# Patient Record
Sex: Male | Born: 1935 | Race: White | Hispanic: No | Marital: Single | Smoking: Never smoker
Health system: Southern US, Community
[De-identification: ages and names within clinical notes are randomized; demographics above are authoritative.]

## PROBLEM LIST (undated history)

## (undated) DIAGNOSIS — R0602 Shortness of breath: Secondary | ICD-10-CM

## (undated) DIAGNOSIS — Z85828 Personal history of other malignant neoplasm of skin: Secondary | ICD-10-CM

## (undated) DIAGNOSIS — R002 Palpitations: Secondary | ICD-10-CM

## (undated) DIAGNOSIS — R55 Syncope and collapse: Secondary | ICD-10-CM

## (undated) DIAGNOSIS — M1990 Unspecified osteoarthritis, unspecified site: Secondary | ICD-10-CM

## (undated) DIAGNOSIS — C801 Malignant (primary) neoplasm, unspecified: Secondary | ICD-10-CM

## (undated) DIAGNOSIS — I10 Essential (primary) hypertension: Secondary | ICD-10-CM

## (undated) HISTORY — PX: FRACTURE SURGERY: SHX138

## (undated) HISTORY — PX: EYE SURGERY: SHX253

## (undated) HISTORY — DX: Malignant (primary) neoplasm, unspecified: C80.1

## (undated) HISTORY — DX: Essential (primary) hypertension: I10

## (undated) HISTORY — DX: Palpitations: R00.2

## (undated) HISTORY — DX: Shortness of breath: R06.02

## (undated) HISTORY — DX: Unspecified osteoarthritis, unspecified site: M19.90

## (undated) HISTORY — DX: Personal history of other malignant neoplasm of skin: Z85.828

## (undated) HISTORY — DX: Syncope and collapse: R55

---

## 2005-01-02 LAB — HM COLONOSCOPY: HM Colonoscopy: NORMAL

## 2005-05-09 ENCOUNTER — Ambulatory Visit (HOSPITAL_COMMUNITY): Admission: RE | Admit: 2005-05-09 | Discharge: 2005-05-09 | Payer: Self-pay | Admitting: Gastroenterology

## 2007-07-18 DIAGNOSIS — R55 Syncope and collapse: Secondary | ICD-10-CM

## 2007-07-18 DIAGNOSIS — R0602 Shortness of breath: Secondary | ICD-10-CM

## 2007-07-18 DIAGNOSIS — R002 Palpitations: Secondary | ICD-10-CM

## 2007-07-18 HISTORY — DX: Shortness of breath: R06.02

## 2007-07-18 HISTORY — DX: Syncope and collapse: R55

## 2007-07-18 HISTORY — PX: US ECHOCARDIOGRAPHY: HXRAD669

## 2007-07-18 HISTORY — DX: Palpitations: R00.2

## 2007-07-18 HISTORY — PX: CARDIOVASCULAR STRESS TEST: SHX262

## 2010-10-30 ENCOUNTER — Ambulatory Visit: Payer: Self-pay | Admitting: Family Medicine

## 2010-11-30 DIAGNOSIS — Z85828 Personal history of other malignant neoplasm of skin: Secondary | ICD-10-CM | POA: Insufficient documentation

## 2010-11-30 DIAGNOSIS — I10 Essential (primary) hypertension: Secondary | ICD-10-CM

## 2010-11-30 HISTORY — DX: Personal history of other malignant neoplasm of skin: Z85.828

## 2010-11-30 HISTORY — DX: Essential (primary) hypertension: I10

## 2010-12-21 NOTE — Assessment & Plan Note (Signed)
Summary: new to est//ccm/pt rsc/cjr/pt rsc from bmp/cjr   Vital Signs:  Patient profile:   77 year old male Height:      65.50 inches Weight:      175 pounds BMI:     28.78 Temp:     97.4 degrees F oral Pulse rate:   72 / minute Pulse rhythm:   regular Resp:     12 per minute BP sitting:   150 / 78  (left arm) Cuff size:   regular  Vitals Entered By: Sid Falcon LPN (November 30, 2010 11:35 AM)  Nutrition Counseling: Patient's BMI is greater than 25 and therefore counseled on weight management options.   History of Present Illness:  new patient to establish care. Patient has history of hypertension. Took himself off medication in November. No side effects. He just decided to stop and change physicians. Reported prior history of dizziness but no syncope. Rare episodes. No other chronic medical problems. No prior surgeries.  Family history unrevealing  Patient is divorced. 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 Drinks one glass of one per night. Nonsmoker.  History skin cancer left ear diagnosed within past year. Questionable squamous cell  Preventive Screening-Counseling & Management  Alcohol-Tobacco     Smoking Status: quit     Year Started: 1955     Year Quit: 1957  Allergies (verified): No Known Drug Allergies  Past History:  Social History: Last updated: 11/30/2010 Occupation:  Proofreader Divorce past smoker (X 2 years only)  Risk Factors: Smoking Status: quit (11/30/2010)  Past Medical History: fainting spells hypertension PMH-FH-SH reviewed for relevance  Social History: Occupation:  Proofreader Divorce past smoker (X 2 years only) Occupation:  employed Smoking Status:  quit  Review of Systems  The patient denies anorexia, fever, weight loss, weight gain, vision loss, decreased hearing, hoarseness, chest pain, syncope, dyspnea on exertion, peripheral edema, prolonged cough, headaches, hemoptysis, abdominal pain, melena,  hematochezia, severe indigestion/heartburn, hematuria, incontinence, genital sores, muscle weakness, suspicious skin lesions, transient blindness, difficulty walking, depression, unusual weight change, abnormal bleeding, enlarged lymph nodes, and testicular masses.    Physical Exam  General:  Well-developed,well-nourished,in no acute distress; alert,appropriate and cooperative throughout examination Head:  normocephalic and atraumatic.   Eyes:  pupils equal, pupils round, and pupils reactive to light.   Ears:  External ear exam shows no significant lesions or deformities.  Otoscopic examination reveals clear canals, tympanic membranes are intact bilaterally without bulging, retraction, inflammation or discharge. Hearing is grossly normal bilaterally. Mouth:  Oral mucosa and oropharynx without lesions or exudates.  Teeth in good repair. Neck:  No deformities, masses, or tenderness noted. Lungs:  Normal respiratory effort, chest expands symmetrically. Lungs are clear to auscultation, no crackles or wheezes. Heart:  Normal rate and regular rhythm. S1 and S2 normal without gallop, murmur, click, rub or other extra sounds. Extremities:  No clubbing, cyanosis, edema, or deformity noted with normal full range of motion of all joints.   Neurologic:  alert & oriented X3 and cranial nerves II-XII intact.   Skin:  no rashes and no suspicious lesions.   Cervical Nodes:  No lymphadenopathy noted Psych:  normally interactive, good eye contact, not anxious appearing, and not depressed appearing.     Impression & Recommendations:  Problem # 1:  ESSENTIAL HYPERTENSION (ICD-401.9) start low dose amlodipine His updated medication list for this problem includes:    Amlodipine Besylate 2.5 Mg Tabs (Amlodipine besylate) ..... One by mouth once daily  Problem #  2:  SKIN CANCER, HX OF (ICD-V10.83)  Complete Medication List: 1)  Aleve 220 Mg Tabs (Naproxen sodium) .... One tab two times a day 2)  Amlodipine  Besylate 2.5 Mg Tabs (Amlodipine besylate) .... One by mouth once daily  Patient Instructions: 1)  Please schedule a follow-up appointment in 1 month.  2)  Check your  Blood Pressure regularly . If it is above:  140/90 you should make an appointment. Prescriptions: AMLODIPINE BESYLATE 2.5 MG TABS (AMLODIPINE BESYLATE) one by mouth once daily  #30 x 5   Entered and Authorized by:   Evelena Peat MD   Signed by:   Evelena Peat MD on 11/30/2010   Method used:   Electronically to        [facility] [phone] (retail)       [address] [place], [place]  [address]       Ph: [phone]       Fax: [phone]   RxID:   [id number]    Orders Added: 1)  New Patient Level III [25366]    Preventive Care Screening  Colonoscopy:    Date:  11/19/2004    Results:  normal

## 2011-01-02 ENCOUNTER — Encounter: Payer: Self-pay | Admitting: Family Medicine

## 2011-01-02 ENCOUNTER — Ambulatory Visit (INDEPENDENT_AMBULATORY_CARE_PROVIDER_SITE_OTHER): Payer: Medicare Other | Admitting: Family Medicine

## 2011-01-02 DIAGNOSIS — M1990 Unspecified osteoarthritis, unspecified site: Secondary | ICD-10-CM

## 2011-01-02 DIAGNOSIS — R319 Hematuria, unspecified: Secondary | ICD-10-CM

## 2011-01-02 DIAGNOSIS — I10 Essential (primary) hypertension: Secondary | ICD-10-CM

## 2011-01-02 DIAGNOSIS — E785 Hyperlipidemia, unspecified: Secondary | ICD-10-CM

## 2011-01-02 DIAGNOSIS — Z0000 Encounter for general adult medical examination without abnormal findings: Secondary | ICD-10-CM

## 2011-01-02 LAB — CBC WITH DIFFERENTIAL/PLATELET
Basophils Absolute: 0 10*3/uL (ref 0.0–0.1)
HCT: 40.4 % (ref 39.0–52.0)
Hemoglobin: 13.8 g/dL (ref 13.0–17.0)
Lymphs Abs: 1.4 10*3/uL (ref 0.7–4.0)
MCHC: 34.2 g/dL (ref 30.0–36.0)
MCV: 98.9 fl (ref 78.0–100.0)
Neutrophils Relative %: 71.5 % (ref 43.0–77.0)
RBC: 4.09 Mil/uL — ABNORMAL LOW (ref 4.22–5.81)
WBC: 7 10*3/uL (ref 4.5–10.5)

## 2011-01-02 LAB — BASIC METABOLIC PANEL
CO2: 29 mEq/L (ref 19–32)
Calcium: 9.1 mg/dL (ref 8.4–10.5)
Chloride: 104 mEq/L (ref 96–112)
Sodium: 138 mEq/L (ref 135–145)

## 2011-01-02 LAB — POCT URINALYSIS DIPSTICK
Bilirubin, UA: NEGATIVE
Leukocytes, UA: NEGATIVE
Nitrite, UA: NEGATIVE
Urobilinogen, UA: 0.2
pH, UA: 6.5

## 2011-01-02 LAB — HEPATIC FUNCTION PANEL
Alkaline Phosphatase: 61 U/L (ref 39–117)
Bilirubin, Direct: 0.2 mg/dL (ref 0.0–0.3)
Total Protein: 6.3 g/dL (ref 6.0–8.3)

## 2011-01-02 LAB — LIPID PANEL: HDL: 68.2 mg/dL (ref >39.00)

## 2011-01-02 LAB — LDL CHOLESTEROL, DIRECT: Direct LDL: 150.5 mg/dL

## 2011-01-02 MED ORDER — AMLODIPINE BESYLATE 5 MG PO TABS: 5.0000 mg | ORAL_TABLET | Freq: Every day | ORAL | Status: DC

## 2011-01-02 MED ORDER — AMLODIPINE BESYLATE 2.5 MG PO TABS: 5.0000 mg | ORAL_TABLET | Freq: Every day | ORAL | Status: DC

## 2011-01-02 MED ORDER — TRAMADOL HCL 50 MG PO TABS: 50.0000 mg | ORAL_TABLET | Freq: Four times a day (QID) | ORAL | Status: AC | PRN

## 2011-01-02 NOTE — Progress Notes (Signed)
Subjective:    Patient ID: Walter Patrick, male    DOB: [birth year], 77 y.o.   MRN: [id number]  Patient seen today for Medicare wellness exam and followup medical problems HPI 1.  Risk factors based on Past Medical , Social, and Family history   Patient has history of hypertension which is recently diagnosed and started on amlodipine 2.5 mg daily. History of skin cancer left ear. Family history and social history reviewed. 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  Limitations in physical activities  Very active physically.   No recent falls and low risk for fall 3.  Depression/mood  No depression issues and denies any anxiety problems 4.  Hearing  No major hearing deficits 5.  ADLs   Fully independent in all ADLs 6.  Cognitive function (orientation to time and place, language, writing, speech,memory) patient has no deficits and short or long-term memory. Oriented to time and place. No deficits with writing or language skills 7.  Home Safety  No safety concerns identified 8.  Height, weight, and visual acuity.  Height and weight are stable. Previous cataract surgery. Has seen a physician regularly 9.  Counseling  Patient skills regarding prevention. Colonoscopy up-to-date. The confluent vaccine and Pneumovax. Fall prevention discussed 10. Recommendation of preventive services.   As above. Recommendation for Pneumovax and flu vaccine but patient declines both. 11. Labs based on risk factors  Lipid panel, hepatic panel, basic metabolic panel, and urinalysis 12. Care Plan   Lab work as above. Vaccines recommended but patient declines   Patient complains of acute issue of hematuria earlier today with urination. Had some burning with urination. Similar episode of gross hematuria on 12/12/10. No history of kidney stones. No prior evaluation for hematuria. Denies any fever or chills. No back pain. Unable to give specimen at this time.  Hypertension recently started on amlodipine 2.5 mg daily with no side  effects. Patient compliant with medication. Denies any dizziness or headaches    osteoarthritis mostly involving knees. Takes Aleve daily with poor control. Early morning stiffness. No erythema or warmth.   Review of Systems  Constitutional: Negative for fever, activity change, appetite change and fatigue.  HENT: Negative for ear pain, congestion and trouble swallowing.   Eyes: Negative for pain and visual disturbance.  Respiratory: Negative for cough, shortness of breath and wheezing.   Cardiovascular: Negative for chest pain and palpitations.  Gastrointestinal: Negative for nausea, vomiting, abdominal pain, diarrhea, constipation, blood in stool, abdominal distention and rectal pain.  Genitourinary: Positive for hematuria. Negative for dysuria, discharge and testicular pain.  Musculoskeletal: Positive for arthralgias. Negative for joint swelling.  Skin: Negative for rash.  Neurological: Negative for dizziness, syncope and headaches.  Hematological: Negative for adenopathy.  Psychiatric/Behavioral: Negative for confusion and dysphoric mood.       Objective:   Physical Exam  Constitutional: He is oriented to person, place, and time. He appears well-developed and well-nourished. No distress.  HENT:  Head: Normocephalic and atraumatic.  Right Ear: External ear normal.  Left Ear: External ear normal.  Mouth/Throat: Oropharynx is clear and moist.  Eyes: Conjunctivae and EOM are normal. Pupils are equal, round, and reactive to light.  Neck: Normal range of motion. Neck supple. No thyromegaly present.  Cardiovascular: Normal rate, regular rhythm and normal heart sounds.   No murmur heard. Pulmonary/Chest: No respiratory distress. He has no wheezes. He has no rales.  Abdominal: Soft. Bowel sounds are normal. He exhibits no distension and no mass. There is no  tenderness. There is no rebound and no guarding. Hernia confirmed negative in the right inguinal area and confirmed negative in the  left inguinal area.  Genitourinary: Prostate normal.        Prostate is moderately enlarged but smooth with no nodules and symmetric  Musculoskeletal: He exhibits no edema.  Lymphadenopathy:    He has no cervical adenopathy.  Neurological: He is alert and oriented to person, place, and time. He displays normal reflexes. No cranial nerve deficit.  Skin: No rash noted.  Psychiatric: He has a normal mood and affect.          Assessment & Plan:   #1 Medicare wellness exam. Preventative issues discussed as above.  #2 hypertension.   Suboptimally controlled. Increase amlodipine to 5 mg daily  #3 gross hematuria by history. Patient unable to give sample this time. Specimen container given with order for urine dipstick and culture if indicated  #4 osteoarthritis mostly involving knees. Add tramadol 50 mg one every 6 hours as needed

## 2011-01-04 NOTE — Progress Notes (Signed)
Quick Note:  LMTCB ______ 

## 2011-01-05 ENCOUNTER — Telehealth: Payer: Self-pay | Admitting: Family Medicine

## 2011-01-05 NOTE — Telephone Encounter (Signed)
See earlier phone note, duplicate

## 2011-01-05 NOTE — Telephone Encounter (Signed)
Pt called and said he was returning a call. Pls call back asap.

## 2011-01-08 NOTE — Progress Notes (Signed)
Quick Note:  Pt informed and he voiced his understanding ______ 

## 2011-01-09 NOTE — Telephone Encounter (Signed)
Yes, pt needs repeat UA per Dr Caryl Never

## 2011-01-09 NOTE — Telephone Encounter (Signed)
Pt stated he suppose to have ua. Please confirm

## 2011-01-29 ENCOUNTER — Ambulatory Visit (INDEPENDENT_AMBULATORY_CARE_PROVIDER_SITE_OTHER): Payer: Medicare Other | Admitting: Family Medicine

## 2011-01-29 ENCOUNTER — Encounter: Payer: Self-pay | Admitting: Family Medicine

## 2011-01-29 DIAGNOSIS — R319 Hematuria, unspecified: Secondary | ICD-10-CM

## 2011-01-29 LAB — POCT URINALYSIS DIPSTICK
Spec Grav, UA: 1.01
Urobilinogen, UA: 0.2

## 2011-01-29 NOTE — Progress Notes (Signed)
  Subjective:    Patient ID: Walter Patrick, male    DOB: July 14, 1932, 77 y.o.   MRN: [id number]  HPI  patient seen with recurrent episode of gross hematuria since middle of last month. No burning with urination. No fever or chills. No prior history of kidney stones. Denies any appetite or weight changes. Overall stays quite active and feels well. No aspirin use. No anticoagulants.   Review of Systems  Constitutional: Negative for fever, chills, appetite change, fatigue and unexpected weight change.  Gastrointestinal: Negative for nausea, vomiting, abdominal pain and blood in stool.  Genitourinary: Positive for hematuria. Negative for dysuria, scrotal swelling and penile pain.       Objective:   Physical Exam  patient is alert and in no distress. Chest clear to auscultation Heart regular rhythm and rate  Back exam no CVA tenderness Abdomen soft and nontender   Urinalysis reveals hematuria on dipstick with only trace leukocytes       Assessment & Plan:   recurrent gross hematuria. Send urine for culture. Doubt infectious. Needs urologic workup and will refer

## 2011-01-31 LAB — URINE CULTURE: Colony Count: NO GROWTH

## 2011-01-31 NOTE — Progress Notes (Signed)
Quick Note:  Pt informed on VM ______ 

## 2011-03-05 ENCOUNTER — Other Ambulatory Visit: Payer: Self-pay | Admitting: Urology

## 2011-03-05 ENCOUNTER — Ambulatory Visit
Admission: RE | Admit: 2011-03-05 | Discharge: 2011-03-05 | Disposition: A | Discharge status: Home / Self Care | Payer: Medicare Other | Source: Ambulatory Visit | Attending: Urology | Admitting: Urology

## 2011-03-05 DIAGNOSIS — Z01811 Encounter for preprocedural respiratory examination: Secondary | ICD-10-CM

## 2011-03-06 ENCOUNTER — Other Ambulatory Visit: Payer: Self-pay | Admitting: Urology

## 2011-03-06 ENCOUNTER — Ambulatory Visit (HOSPITAL_BASED_OUTPATIENT_CLINIC_OR_DEPARTMENT_OTHER)
Admission: RE | Admit: 2011-03-06 | Discharge: 2011-03-07 | Disposition: A | Discharge status: Home / Self Care | Payer: Medicare Other | Source: Ambulatory Visit | Attending: Urology | Admitting: Urology

## 2011-03-06 DIAGNOSIS — Z79899 Other long term (current) drug therapy: Secondary | ICD-10-CM | POA: Insufficient documentation

## 2011-03-06 DIAGNOSIS — N421 Congestion and hemorrhage of prostate: Secondary | ICD-10-CM | POA: Insufficient documentation

## 2011-03-06 DIAGNOSIS — N320 Bladder-neck obstruction: Secondary | ICD-10-CM | POA: Insufficient documentation

## 2011-03-06 DIAGNOSIS — R310 Gross hematuria: Secondary | ICD-10-CM | POA: Insufficient documentation

## 2011-03-06 DIAGNOSIS — N3289 Other specified disorders of bladder: Secondary | ICD-10-CM | POA: Insufficient documentation

## 2011-03-06 DIAGNOSIS — I10 Essential (primary) hypertension: Secondary | ICD-10-CM | POA: Insufficient documentation

## 2011-03-06 LAB — POCT I-STAT 4, (NA,K, GLUC, HGB,HCT)
HCT: 42 % (ref 39.0–52.0)
Hemoglobin: 14.3 g/dL (ref 13.0–17.0)
Potassium: 4.1 mEq/L (ref 3.5–5.1)
Sodium: 140 mEq/L (ref 135–145)

## 2011-03-07 LAB — DIFFERENTIAL
Lymphocytes Relative: 13 % (ref 12–46)
Lymphs Abs: 1.6 10*3/uL (ref 0.7–4.0)
Monocytes Absolute: 1.4 10*3/uL — ABNORMAL HIGH (ref 0.1–1.0)
Monocytes Relative: 11 % (ref 3–12)
Neutro Abs: 9.6 10*3/uL — ABNORMAL HIGH (ref 1.7–7.7)

## 2011-03-07 LAB — BASIC METABOLIC PANEL
CO2: 27 mEq/L (ref 19–32)
Calcium: 8.7 mg/dL (ref 8.4–10.5)
Glucose, Bld: 91 mg/dL (ref 70–99)
Sodium: 137 mEq/L (ref 135–145)

## 2011-03-07 LAB — CBC
HCT: 36.5 % — ABNORMAL LOW (ref 39.0–52.0)
Hemoglobin: 12.3 g/dL — ABNORMAL LOW (ref 13.0–17.0)
MCH: 32.7 pg (ref 26.0–34.0)
MCHC: 33.7 g/dL (ref 30.0–36.0)

## 2011-03-08 NOTE — Op Note (Signed)
  [name], [name]                 ACCOUNT NO.:  [id number]  MEDICAL RECORD NO.:  [id number]           PATIENT TYPE:  LOCATION:                                 FACILITY:  PHYSICIAN:  Maretta Bees. Vonita Moss, M.D.     DATE OF BIRTH:  DATE OF PROCEDURE:  03/06/2011 DATE OF DISCHARGE:                              OPERATIVE REPORT   PREOPERATIVE DIAGNOSIS:  Recurrent gross hematuria and bladder outlet obstruction.  POSTOPERATIVE DIAGNOSIS:  Recurrent gross hematuria and bladder outlet obstruction.  PROCEDURE:  Cystoscopy and transurethral resection of bladder neck and prostate.  SURGEON:  Maretta Bees. Vonita Moss, M.D.  ANESTHESIA:  General.  INDICATIONS:  This gentleman has had intermittent gross hematuria since the first of the year.  IV contrasted CT scan showed no upper tract lesions.  Cystoscopy in the office revealed bleeding from the bladder neck and trigonal area.  He appeared to have inflamed mucosa in the bladder neck and trigone.  No obvious tumor was seen.  He is brought to the OR today for further evaluation and treatment.  PROCEDURE IN DETAIL:  The patient was brought to the operating room and placed in lithotomy position.  External genitalia prepped and draped in usual fashion.  He was cystoscoped and the anterior urethra was normal. The prostate bled easily from increased vascularity in mucosa and there was significant edema on the floor of the prostate and in the office it appeared to be inflammation on to the trigone with median lobe projecting into the bladder and actually obscuring the ureteral orifices.  I felt that resection of this tissue would correct his recurrent prostatic bleeding.  Therefore, I first resected the median lobe of the prostate. Indigo carmine was injected and at the end of the case, I saw blue- stained urine exiting from the ureteral orifices well away from our resection area.  The rest of the prostate kept bleeding easily, so I felt that I  should proceed with resection of this bleeding lateral lobe tissue to prevent future bleeding problems and facilitate his voiding. Therefore, I resected the prostate floor and then lateral lobe tissues down to capsule including some anterior tissue.  I used a Gyrus scope and at the end of the case fulgurated the prostatic fossa which was now well excavated.  There was good hemostasis at the end of the case. Estimated blood loss was approximately 50 mL.  He tolerated the procedure well.  Chips were removed from the bladder.  Ureteral orifices and external sphincter were intact when I completed the case and looking my way out.  I then inserted 24-French 30 mL Foley catheter and put 40 mL in the balloon and placed on traction with clear irrigation.  He was taken to recovery room in good condition, having tolerated the procedure well.     Maretta Bees. Vonita Moss, M.D.     LJP/MEDQ  D:  03/06/2011  T:  03/06/2011  Job:  [id number]  cc:   Evelena Peat, M.D.  Electronically Signed by Larey Dresser M.D. on 03/08/2011 05:51:23 PM

## 2011-04-05 ENCOUNTER — Ambulatory Visit (INDEPENDENT_AMBULATORY_CARE_PROVIDER_SITE_OTHER): Payer: Medicare Other | Admitting: Family Medicine

## 2011-04-05 ENCOUNTER — Encounter: Payer: Self-pay | Admitting: Family Medicine

## 2011-04-05 VITALS — BP 138/80 | Temp 98.8°F | Wt 170.0 lb

## 2011-04-05 DIAGNOSIS — C679 Malignant neoplasm of bladder, unspecified: Secondary | ICD-10-CM

## 2011-04-05 NOTE — Progress Notes (Signed)
  Subjective:    Patient ID: Walter Patrick, male    DOB: 08-24-1932, 77 y.o.   MRN: [id number]  HPI Patient is here to discuss recent cancer diagnosis. Presented here with gross hematuria. Refer to urologist and workup has revealed invasive bladder cancer reportedly into prostate. That with local urologist yesterday. Per patient, recommendation to pursue bladder removal with stent. Patient is non-committed to surgery at this time. He is questioning whether he should get a second opinion. He has not had any further gross hematuria. Appetite and weight are stable.  Patient is otherwise in excellent health. He has history of hypertension treated with amlodipine 5 mg daily. Blood pressure stable. No history of smoking other than briefly less than one year in his 54s. Did work around Museum/gallery exhibitions officer for several years   Review of Systems  Constitutional: Negative for fever, appetite change and unexpected weight change.  Respiratory: Negative for cough and shortness of breath.   Cardiovascular: Negative for chest pain, palpitations and leg swelling.  Gastrointestinal: Negative for abdominal pain.  Genitourinary: Negative for dysuria and hematuria.  Hematological: Negative for adenopathy. Does not bruise/bleed easily.       Objective:   Physical Exam  Constitutional: He appears well-developed and well-nourished.  Cardiovascular: Normal rate, regular rhythm and normal heart sounds.   Pulmonary/Chest: Effort normal and breath sounds normal. No respiratory distress. He has no wheezes. He has no rales.  Musculoskeletal: He exhibits no edema.  Psychiatric: He has a normal mood and affect.          Assessment & Plan:  Invasive bladder cancer. Patient requesting second opinion. We'll try to set up at  [facility].

## 2011-04-06 NOTE — Op Note (Signed)
[name], Walter Patrick                 ACCOUNT NO.:  [id number]   MEDICAL RECORD NO.:  [id number]          PATIENT TYPE:  AMB   LOCATION:  ENDO                         FACILITY:  [facility]   PHYSICIAN:  John C. Madilyn Fireman, M.D.    DATE OF BIRTH:  [birth year]   DATE OF PROCEDURE:  05/09/2005  DATE OF DISCHARGE:                                 OPERATIVE REPORT   INDICATIONS FOR PROCEDURE:  Average risk colon cancer screening.   PROCEDURE:  The patient was placed in the left lateral decubitus position  and placed on the pulse monitor with continuous low-flow oxygen delivered by  nasal cannula. He was sedated with 50 mcg IV fentanyl and 5 mg IV Versed.  Olympus video colonoscope was inserted into the rectum and advanced to the  cecum, confirmed by transillumination of McBurney's point and visualization  of ileocecal valve and appendiceal orifice. Prep was good. The cecum,  ascending, transverse and descending colon all appeared normal with no  masses, polyps, diverticula or other mucosal abnormalities. Within the  sigmoid colon, there was seen a few scattered diverticula; no other  abnormalities. The rectum appeared normal. Retroflexed view of the anus  revealed no obvious internal hemorrhoids. The scope was then withdrawn and  the patient returned to the recovery room in stable condition. He tolerated  the procedure well. There were no immediate complications.   IMPRESSION:  Diverticulosis.   PLAN:  Repeat colonoscopy within 10 years. Consider flexible sigmoidoscopy  and/or Hemoccults in 5 years.       JCH/MEDQ  D:  05/09/2005  T:  05/09/2005  Job:  [id number]   cc:   Vikki Ports, M.D.  [address]  [place]  [place] [address]  Fax: [phone]

## 2011-05-03 ENCOUNTER — Encounter: Payer: Self-pay | Admitting: Family Medicine

## 2011-05-03 ENCOUNTER — Ambulatory Visit (INDEPENDENT_AMBULATORY_CARE_PROVIDER_SITE_OTHER): Payer: Medicare Other | Admitting: Family Medicine

## 2011-05-03 VITALS — BP 140/70 | Temp 98.2°F | Wt 149.0 lb

## 2011-05-03 DIAGNOSIS — C679 Malignant neoplasm of bladder, unspecified: Secondary | ICD-10-CM | POA: Insufficient documentation

## 2011-05-03 DIAGNOSIS — I10 Essential (primary) hypertension: Secondary | ICD-10-CM

## 2011-05-03 NOTE — Progress Notes (Signed)
  Subjective:    Patient ID: Walter Patrick, male    DOB: 1932/10/04, 77 y.o.   MRN: [id number]  HPI Patient here to discuss issues regarding recent diagnosis bladder cancer. [facility] opinion. Basically had several questions and we explained that we cannot answer a lot of his questions which deal with treatment strategies for bladder cancer. Occasional gross blood but for the most part no dysuria. Appetite fair. Weight stable.  Takes amlodipine for hypertension. No orthostasis.  Review of Systems  Constitutional: Negative for fever, chills and appetite change.  Gastrointestinal: Negative for abdominal pain.  Genitourinary: Positive for hematuria. Negative for dysuria.       Objective:   Physical Exam  Constitutional: He is oriented to person, place, and time. He appears well-developed and well-nourished. No distress.  Cardiovascular: Normal rate and regular rhythm.   Pulmonary/Chest: Effort normal and breath sounds normal. No respiratory distress. He has no wheezes. He has no rales.  Musculoskeletal: He exhibits no edema.  Neurological: He is alert and oriented to person, place, and time.          Assessment & Plan:  #1 bladder cancer. Patient is encouraged to follow up with local urologist and we have given him several questions to consider asking help clarify and make his decision. #2 hypertension stable continue current medication

## 2011-06-20 ENCOUNTER — Other Ambulatory Visit: Payer: Self-pay | Admitting: Urology

## 2011-06-20 ENCOUNTER — Encounter (HOSPITAL_COMMUNITY): Payer: Medicare Other

## 2011-06-20 LAB — BASIC METABOLIC PANEL
BUN: 18 mg/dL (ref 6–23)
Calcium: 9.6 mg/dL (ref 8.4–10.5)
GFR calc non Af Amer: >60 mL/min (ref >60)
Glucose, Bld: 79 mg/dL (ref 70–99)
Sodium: 138 mEq/L (ref 135–145)

## 2011-06-20 LAB — CBC
Hemoglobin: 13.6 g/dL (ref 13.0–17.0)
MCH: 32.9 pg (ref 26.0–34.0)
MCHC: 33.3 g/dL (ref 30.0–36.0)

## 2011-06-20 LAB — SURGICAL PCR SCREEN: Staphylococcus aureus: NEGATIVE

## 2011-06-27 ENCOUNTER — Inpatient Hospital Stay (HOSPITAL_COMMUNITY)
Admission: RE | Admit: 2011-06-27 | Discharge: 2011-07-02 | DRG: 655 | Disposition: A | Discharge status: Home Health | Payer: Medicare Other | Source: Ambulatory Visit | Attending: Urology | Admitting: Urology

## 2011-06-27 ENCOUNTER — Other Ambulatory Visit: Payer: Self-pay | Admitting: Urology

## 2011-06-27 DIAGNOSIS — Z01810 Encounter for preprocedural cardiovascular examination: Secondary | ICD-10-CM

## 2011-06-27 DIAGNOSIS — C675 Malignant neoplasm of bladder neck: Principal | ICD-10-CM | POA: Diagnosis present

## 2011-06-27 DIAGNOSIS — I10 Essential (primary) hypertension: Secondary | ICD-10-CM | POA: Diagnosis present

## 2011-06-27 DIAGNOSIS — Z01812 Encounter for preprocedural laboratory examination: Secondary | ICD-10-CM

## 2011-06-27 HISTORY — PX: CYSTECTOMY: SUR359

## 2011-06-27 LAB — BASIC METABOLIC PANEL
Chloride: 106 mEq/L (ref 96–112)
GFR calc Af Amer: >60 mL/min (ref >60)
Potassium: 4 mEq/L (ref 3.5–5.1)
Sodium: 135 mEq/L (ref 135–145)

## 2011-06-27 LAB — ABO/RH: ABO/RH(D): B POS

## 2011-06-27 LAB — CBC
HCT: 32 % — ABNORMAL LOW (ref 39.0–52.0)
Hemoglobin: 11.1 g/dL — ABNORMAL LOW (ref 13.0–17.0)
WBC: 12.5 10*3/uL — ABNORMAL HIGH (ref 4.0–10.5)

## 2011-06-28 LAB — POCT I-STAT 4, (NA,K, GLUC, HGB,HCT)
Glucose, Bld: 112 mg/dL — ABNORMAL HIGH (ref 70–99)
Potassium: 4 mEq/L (ref 3.5–5.1)
Sodium: 134 mEq/L — ABNORMAL LOW (ref 135–145)

## 2011-06-28 LAB — TYPE AND SCREEN: Unit division: 0

## 2011-06-28 LAB — CBC
Hemoglobin: 10.3 g/dL — ABNORMAL LOW (ref 13.0–17.0)
MCH: 31.3 pg (ref 26.0–34.0)
MCV: 94.2 fL (ref 78.0–100.0)
RBC: 3.29 MIL/uL — ABNORMAL LOW (ref 4.22–5.81)
WBC: 10.3 10*3/uL (ref 4.0–10.5)

## 2011-06-28 LAB — POCT I-STAT 7, (LYTES, BLD GAS, ICA,H+H)
Acid-base deficit: 5 mmol/L — ABNORMAL HIGH (ref 0.0–2.0)
Bicarbonate: 23 mEq/L (ref 20.0–24.0)
Bicarbonate: 18.7 mEq/L — ABNORMAL LOW (ref 20.0–24.0)
Hemoglobin: 11.9 g/dL — ABNORMAL LOW (ref 13.0–17.0)
O2 Saturation: 100 %
O2 Saturation: 100 %
Patient temperature: 35.9
Potassium: 3.9 mEq/L (ref 3.5–5.1)
Potassium: 3.8 mEq/L (ref 3.5–5.1)
Sodium: 138 mEq/L (ref 135–145)
TCO2: 24 mmol/L (ref 0–100)
TCO2: 20 mmol/L (ref 0–100)
pCO2 arterial: 33 mmHg — ABNORMAL LOW (ref 35.0–45.0)
pH, Arterial: 7.403 (ref 7.350–7.450)

## 2011-06-28 LAB — BASIC METABOLIC PANEL
CO2: 27 mEq/L (ref 19–32)
Calcium: 8 mg/dL — ABNORMAL LOW (ref 8.4–10.5)
Chloride: 103 mEq/L (ref 96–112)
Creatinine, Ser: 0.54 mg/dL (ref 0.50–1.35)
Glucose, Bld: 152 mg/dL — ABNORMAL HIGH (ref 70–99)
Sodium: 133 mEq/L — ABNORMAL LOW (ref 135–145)

## 2011-06-29 LAB — BASIC METABOLIC PANEL
CO2: 28 mEq/L (ref 19–32)
Calcium: 8.4 mg/dL (ref 8.4–10.5)
Chloride: 105 mEq/L (ref 96–112)
Creatinine, Ser: 0.56 mg/dL (ref 0.50–1.35)
Glucose, Bld: 132 mg/dL — ABNORMAL HIGH (ref 70–99)

## 2011-06-29 LAB — CBC
HCT: 28.6 % — ABNORMAL LOW (ref 39.0–52.0)
Hemoglobin: 9.9 g/dL — ABNORMAL LOW (ref 13.0–17.0)
MCH: 32.7 pg (ref 26.0–34.0)
MCV: 94.4 fL (ref 78.0–100.0)
Platelets: 140 10*3/uL — ABNORMAL LOW (ref 150–400)
RBC: 3.03 MIL/uL — ABNORMAL LOW (ref 4.22–5.81)
WBC: 10.8 10*3/uL — ABNORMAL HIGH (ref 4.0–10.5)

## 2011-06-29 LAB — CREATININE, FLUID (PLEURAL, PERITONEAL, JP DRAINAGE): Creat, Fluid: 0.4 mg/dL

## 2011-06-30 LAB — CBC
HCT: 30 % — ABNORMAL LOW (ref 39.0–52.0)
Hemoglobin: 10.1 g/dL — ABNORMAL LOW (ref 13.0–17.0)
MCH: 32.1 pg (ref 26.0–34.0)
MCHC: 33.7 g/dL (ref 30.0–36.0)
RBC: 3.15 MIL/uL — ABNORMAL LOW (ref 4.22–5.81)

## 2011-06-30 LAB — BASIC METABOLIC PANEL
BUN: 9 mg/dL (ref 6–23)
CO2: 28 mEq/L (ref 19–32)
Calcium: 8.8 mg/dL (ref 8.4–10.5)
Glucose, Bld: 110 mg/dL — ABNORMAL HIGH (ref 70–99)
Potassium: 3.5 mEq/L (ref 3.5–5.1)
Sodium: 137 mEq/L (ref 135–145)

## 2011-06-30 NOTE — H&P (Signed)
NAMEMINER, KORAL                 ACCOUNT NO.:  [id number]  MEDICAL RECORD NO.:  [id number]  LOCATION:  0004                         FACILITY:  [facility]  PHYSICIAN:  Bertram Millard. Marchella Hibbard, M.D.DATE OF BIRTH:  1931/11/26  DATE OF ADMISSION:  06/27/2011 DATE OF DISCHARGE:                             HISTORY & PHYSICAL   REASON FOR ADMISSION:  Invasive urothelial carcinoma of the bladder.  BRIEF HISTORY:  Walter Patrick is a 77 year old male who presents at this time for a cystoprostatectomy.  He presented to our office in mid March 2012 with a 66-month history of gross painless hematuria.  He initially underwent ultrasound of the kidneys and the bladder.  The upper tracts were normal, the bladder showed a mass around the bladder neck.  CT scan of the abdomen and pelvis revealed a large prostate as well as a thickened bladder wall with no evidence of extrinsic disease.  He was seen by Dr. Vonita Moss and underwent cystoscopy, with followup TURP. Bladder specimen revealed high-grade urothelial carcinoma of the bladder with invasion.  The tumor did not stain for PSA.  The patient presents at this time for a cystoprostatectomy.  The risks and complications of the procedure have been discussed with the patient.  He understands these and desires to proceed.  PAST MEDICAL HISTORY:  Significant for shoulder surgery on the right side as well as his TURBT.  He is hypertensive.  MEDICATIONS:  Include Vasotec 5 mg.  ALLERGIES:  He denies any drug allergies.  SOCIAL HISTORY:  The patient is divorced.  He occasionally uses alcohol. He is in the Holiday representative business.  He quit a one-pack a day smoking history in 1960.  He has children.  REVIEW OF SYSTEMS:  Noncontributory except for a recent history of gross hematuria.  He has no chest pain, cough, or sputum production.  No abdominal pain.  PHYSICAL EXAMINATION:  GENERAL:  Revealed a pleasant, healthy appearing male, appearing younger than his stated  age of 24. NECK:  Supple. HEENT:  Normal. CHEST:  Revealed clear breath sounds bilaterally. HEART:  Regular rate and rhythm. ABDOMEN:  Flat, soft, nondistended, nontender.  No mass, no megaly. Bladder nonpalpable. EXTERNAL GENITALIA:  Normal.  Urethral meatus was adequate.  Testicles were normal. RECTAL EXAM:  Revealed 3+ gland without tenderness.  There was a nodule at the base of the prostate in the midline, which was soft.  Seminal vesicles were nonpalpable. LYMPHATIC EXAM:  Revealed no adenopathy.  He had no peripheral edema. NEUROLOGIC: Grossly intact.  ADMISSION STUDIES:  EKG revealed no acute changes.  CBC revealed a hematocrit of 41%, white count of 60-100, platelet count of 190,000. Basic metabolic panel was normal.  Recent chest x-ray showed no evident disease.  IMPRESSION:  Urothelial carcinoma of the bladder, invasive.  He has no evidence of metastatic disease.  PLAN: 1. We will admit him for radical cystoprostatectomy and ileal conduit     urinary diversion. 2. He will be admitted to the step-down unit postoperatively.     Bertram Millard. Cyleigh Massaro, M.D.     SMD/MEDQ  D:  06/27/2011  T:  06/27/2011  Job:  [id number]  Electronically Signed by Walter Patrick.D.  on 06/30/2011 09:44:43 AM

## 2011-06-30 NOTE — Op Note (Signed)
[name], Walter Patrick                 ACCOUNT NO.:  [id number]  MEDICAL RECORD NO.:  [id number]  LOCATION:  1227                         FACILITY:  [facility]  PHYSICIAN:  Bertram Millard. Dymond Spreen, M.D.DATE OF BIRTH:  10-May-1932  DATE OF PROCEDURE:  06/27/2011 DATE OF DISCHARGE:                              OPERATIVE REPORT   PREOPERATIVE DIAGNOSIS:  Invasive urothelial carcinoma of the bladder.  POSTOPERATIVE DIAGNOSIS:  Invasive urothelial carcinoma of the bladder.  PRINCIPAL PROCEDURE:  Radical cystoprostatectomy, bilateral pelvic lymph node dissection, creation of ileal conduit.  SURGEON:  Bertram Millard. Jacoya Bauman, M.D.  FIRST ASSISTANT:  Delia Chimes, NP  COMPLICATIONS:  None.  ESTIMATED BLOOD LOSS:  2000 mL.  FLUID REPLACEMENT:  1 L Hextend, 4 L crystalloid, 2 units packed red blood cells.  SPECIMENS:  Left distal ureter, right distal ureteral sections, urethral margin, bladder and prostate, bilateral pelvic lymph nodes.  ANESTHESIA:  General endotracheal.  COMPLICATIONS:  None.  BRIEF HISTORY:  77 year old male who presents at this time for definitive surgical management of invasive urothelial carcinoma of the bladder.  The patient has delayed his treatment, with decision on proceeding with his surgery.  However, he recently decided to proceed. Metastatic survey including CT scan and chest films have revealed no evidence of extra vesicle disease.  He is aware of risks and complications of the procedure and desires to proceed.  DESCRIPTION OF PROCEDURE:  The patient was identified in the holding area preoperatively and received preoperative IV Unasyn.  PAS and TED hose were in place.  He received Entereg preoperatively as well.  He was taken to the operating room, where general anesthetic was administered. He was placed in a recumbent position.  Genitalia, perineum and lower abdomen were prepped and draped.  Cut time-out was then performed.  The procedure commenced.  His  bladder was catheterized with a 20-French coude tip catheter.  This was hooked to dependent drainage.  A midline incision was made from the pubis to the area just inferior to the umbilicus.  This was carried down into the perineal cavity.  The urachus was identified, incised from the umbilicus down to the dome of the bladder.  The Bookwalter retractor was then placed.  Adhesions between the sigmoid colon and left lateral pelvic wall were sharply dissected. I then cleared a space of Retzius with blunt dissection.  The LigaSure was used to dissect laterally to the bladder, freeing up lateral leaves to the bladder.  Dissection was then performed posterior to the bladder, between the bladder and the rectum, freeing up the peritoneal edge with electrocautery.  I then carefully identified the right and left ureters, and dissected them free.  They were dissected down to the bladder hiatus and both were clipped distally.  Frozen sections were sent from the left and right distal ureter at this time.  I then completed the posterior dissection, and freed up plane between the rectum and the bladder, posterior to the bladder with blunt dissection.  The LigaSure was then used to ligate and divide the bladder pedicles bilaterally.  I then used LigaSure to ligate and divide the dorsal vein complex.  The urethra was identified, dissected circumferentially with a  right angle, and then divided below the prostate.  Dissection was then carried out in this area, sharply incising the rectourethralis muscle.  The plane behind the inferior or behind the prostate was joined with the plane posterior to the bladder and the remaining bladder pedicles were ligated and divided with the LigaSure.  The bladder and prostate were then passed from the table, with the seminal vesicles intact.  Frozen section on the left returned no evidence of carcinoma, the frozen section on the right ureter revealed CIS present at the  distal margin.  Two other ureteral segments were then taken, both approximately 1-1.5 cm in length.  The final frozen section revealed no evidence of carcinoma at the proximal margin that was on the right side.  Bilateral pelvic lymph node dissections were then performed, with the Cooper ligament inferiorly, the external iliac artery anteriorly, the bifurcation of the common iliac artery superiorly and the obturator nerve posteriorly.  Both tissue packets were sent as left and right pelvic lymph nodes, respectively.  They were sent for permanent section.  At this point, attention was turned to the area of dissection or area of resection of the bladder and prostate.  There was some bleeding in the area of the dorsal vein complex which was controlled with electrocautery and the LigaSure.  FloSeal was placed in this area eventually, and eventually an 18-French Foley catheter was placed with 40 mL of water in the balloon with Surgicel being placed underneath this between the balloon and the inferior pelvic wall.  This provided adequate hemostasis at this point. The creation of the ileal loop was then started.  Approximately 10-12 cm of terminal ileum were identified, and the mesentery incised at that the proximal and distal extents of this.  The mesenteric border of the ileum was dissected free of fat, and the GIA 55 stapler was then used to staple and divide the ileum and the proximal and distal extent of the ileal conduit.  A side-to-side, functional end-to-end ileoileostomy was then performed using the GIA 55 in the TA staplers, respectively. Following this, the mesenteric window was closed using interrupted 3-0 silk pop offs.  The ileal conduit had been past inferior to the anastomosis.  I then created the anastomoses between the ureters and the proximal end of the ileal conduit.  The left ureter had been passed behind the sigmoid colon, and a very good length was remaining on this. The  right ureter was quite shortened due to the multiple frozen sections.  The ends of both ureters were spatulated, and they were sutured to separate enterotomies.  The left ureter was sutured on the inferior edge of the proximal ileal conduit, with the right ureter being anastomosed to the superior border of this.  The 4-0 PDS was used to create the anastomoses in a running simple fashion, with two separate sutures being used for each anastomosis.  The ileal conduit was flushed with saline after this under pressure.  No leak was seen on the left anastomoses, small leaks from the right anastomosis were closed with a short lengths of 4-0 PDS.  At this point, no further leaks were seen. The pelvis was irrigated with saline.  The distal end of the ileal conduit was brought through the newly formed stoma just lateral to the umbilicus on the right.  The rectus fascia was incised in a cruciate fashion.  The ileal conduit was easily brought through to the created stoma, and the stoma was matured in a nicely everted  fashion using 3-0 interrupted Vicryls.  The remaining ileum was then brought all the way to the left of the ileal conduit in the pelvis, such that no internal hernia could form.  The ileal conduit was then tacked to the right lateral pelvic wall with one suture of 3-0 silk.  The pelvis was checked, and was hemostatic at this point.  A 10-flat fully fluted Blake drain was brought through the left lower abdomen through a separate stab incision and the drain was placed in a dependent position in the pelvis. It was sutured to the skin with a 3-0 nylon.  After the pelvis had been irrigated with saline, the fascial edges were reapproximated using a running #1 PDS placed in a simple fashion.  Skin edges were reapproximated using skin clips.  An ostomy appliance was placed over the stoma, and a dry sterile dressing was placed.  The Foley catheter was left in, with the balloon inflated to 40 mL  and it was placed on gentle traction to help with hemostasis in the dorsal vein complex area.  The patient tolerated the procedure well.  Sponge, needle, and instrument counts were correct x2.  He was awakened and taken to the PACU in stable condition.     Bertram Millard. Retta Diones, M.D.     SMD/MEDQ  D:  06/27/2011  T:  06/28/2011  Job:  [id number]  cc:   Evelena Peat, M.D.  Electronically Signed by Marcine Matar M.D. on 06/30/2011 09:44:46 AM

## 2011-07-01 LAB — BASIC METABOLIC PANEL
CO2: 29 mEq/L (ref 19–32)
Calcium: 8.7 mg/dL (ref 8.4–10.5)
Creatinine, Ser: 0.73 mg/dL (ref 0.50–1.35)
GFR calc non Af Amer: >60 mL/min (ref >60)
Glucose, Bld: 98 mg/dL (ref 70–99)

## 2011-07-01 LAB — CBC
Hemoglobin: 8.4 g/dL — ABNORMAL LOW (ref 13.0–17.0)
MCH: 32.1 pg (ref 26.0–34.0)
MCHC: 34.3 g/dL (ref 30.0–36.0)
MCV: 93.5 fL (ref 78.0–100.0)
RBC: 2.62 MIL/uL — ABNORMAL LOW (ref 4.22–5.81)

## 2011-07-02 LAB — CBC
MCH: 31.8 pg (ref 26.0–34.0)
MCHC: 33.5 g/dL (ref 30.0–36.0)
MCV: 95 fL (ref 78.0–100.0)
Platelets: 218 10*3/uL (ref 150–400)
RDW: 15.9 % — ABNORMAL HIGH (ref 11.5–15.5)

## 2011-07-02 LAB — BASIC METABOLIC PANEL
CO2: 28 mEq/L (ref 19–32)
Calcium: 8.7 mg/dL (ref 8.4–10.5)
Creatinine, Ser: 0.69 mg/dL (ref 0.50–1.35)
GFR calc Af Amer: >60 mL/min (ref >60)
GFR calc non Af Amer: >60 mL/min (ref >60)
Sodium: 137 mEq/L (ref 135–145)

## 2011-07-02 LAB — HEMOGLOBIN AND HEMATOCRIT, BLOOD: Hemoglobin: 8.8 g/dL — ABNORMAL LOW (ref 13.0–17.0)

## 2011-07-08 NOTE — Discharge Summary (Signed)
Walter Patrick, Walter Patrick                 ACCOUNT NO.:  [id number]  MEDICAL RECORD NO.:  [id number]  LOCATION:  1425                         FACILITY:  [facility]  PHYSICIAN:  Bertram Millard. Ceanna Wareing, M.D.DATE OF BIRTH:  [birth year]  DATE OF ADMISSION:  06/27/2011 DATE OF DISCHARGE:  07/02/2011                              DISCHARGE SUMMARY   ADMISSION DIAGNOSIS:  Invasive urothelial carcinoma of the bladder.  DISCHARGE DIAGNOSES:  Invasive urothelial carcinoma of the bladder.  PROCEDURE: 1. Radical cystoprostatectomy. 2. Bilateral pelvic lymph node dissection. 3. Creation of ileal conduit.  HISTORY AND PHYSICAL:  For full detail, please see admission history and physical.  Briefly, this is a 77 year old gentleman who was found to have invasive urothelial carcinoma of the bladder.  Metastatic survey including CT scan and chest films were obtained and revealed no evidence of extravesical disease.  After careful consideration regarding management options for treatment, he elected to proceed with surgical therapy and a radical cystoprostatectomy with creation of ileal conduit and bilateral pelvic lymphadenectomy.  HOSPITAL COURSE:  On June 27, 2011, he was taken to the operating room where he underwent the above-named procedures, in which he tolerated well and without complications.  Postoperatively, he was transferred to the step-down ICU unit for close observation overnight.  He was found to be hemodynamically stable postoperatively as noted by postop hemoglobin of 11.1.  He was also found to be hemodynamically stable on postop day #1 as noted by hemoglobin of 10.3.  His hemoglobin did slightly drop on postoperative day #3 and #4 to 8.4, but subsequently began to rise again on postop day #5 as noted by hemoglobin of 8.8.  His serum creatinine was found to be consistently within normal limits at 0.56 postop and prior to discharge home at 0.69.    On the afternoon of postoperative day #1,  he was found to be stable enough for transfer to a regular hospital room with telemetry monitoring.  He was also started on sips of clear liquids.  He was able to be transitioned to a regular diet over the course of the next 24-48 hours.  On postoperative day #2, he was able to began oral pain medication.  Throughout his hospitalization, he was found to have excellent urine output with minimal output from his pelvic drain.  Therefore, on postoperative day #3, fluid was sent to check for creatinine.  It returned consistent with serum at 0.4.  Therefore, on postoperative day #4, his pelvic drain was removed.    He receivedn assistance from the wound ostomy nurse throughout his hospitalization. They provided training and education with his urostomy pouch.  Home health nursing care was set up for him on an outpatient basis to continue to assist him with his new urostomy.  By postoperative day #5, he was ambulating without difficulty and his pain was well managed.  He was passing flatus and tolerating a regular diet.  He was therefore felt stable for discharge home as he met all discharge criteria.  DISPOSITION:  Home.  DISCHARGE INSTRUCTIONS:  He was instructed to be ambulatory, but specifically told to refrain from any heavy lifting, strenuous activity, or driving.  He was instructed to  continue a regular diet and to contact Dr Retta Diones for any nausea or vomiting.  He was also provided education and training with his new urostomy site.  DISCHARGE MEDICATIONS:  He was instructed to resume all home medications.  In addition, he was provided a prescription for Percocet to take as needed for pain and told to use Colace as a stool softener.  FOLLOWUP:  He will follow up in 7 days with Dr Dahltstedt for further postoperative evaluation including removal of skin staples.     Delia Chimes, NP   ______________________________ Bertram Millard. Makesha Belitz, M.D.    MA/MEDQ  D:  07/02/2011   T:  07/03/2011  Job:  [id number]  Electronically Signed by Delia Chimes NP on 07/03/2011 12:32:34 PM Electronically Signed by Marcine Matar M.D. on 07/08/2011 01:31:48 PM

## 2011-08-08 ENCOUNTER — Ambulatory Visit (INDEPENDENT_AMBULATORY_CARE_PROVIDER_SITE_OTHER): Payer: Medicare Other | Admitting: Family Medicine

## 2011-08-08 ENCOUNTER — Encounter: Payer: Self-pay | Admitting: Family Medicine

## 2011-08-08 DIAGNOSIS — K5900 Constipation, unspecified: Secondary | ICD-10-CM

## 2011-08-08 NOTE — Progress Notes (Signed)
  Subjective:    Patient ID: Walter Patrick, male    DOB: [birth year], 77 y.o.   MRN: [id number]  HPI Persistent constipation. Bladder cancer. Bladder removal 06/27/2011. Has been constipated since then. Took some mag citrate without much improvement. No stool for the past 3 days. Denies nausea vomiting. Not taking any pain medications. Drinking plenty of fluids. Ambulating fairly well. Patient had colonoscopy 2006. He does not take any anti-cholinergic drugs.  He's lost about 18 pounds since his surgery but weight is overall stable over the past 2 weeks  Past Medical History  Diagnosis Date  . ESSENTIAL HYPERTENSION 11/30/2010  . SKIN CANCER, HX OF 11/30/2010  . Arthritis    Past Surgical History  Procedure Date  . Cystectomy 06/27/11    reports that he has never smoked. He does not have any smokeless tobacco history on file. He reports that he drinks alcohol. His drug history not on file. family history includes Heart disease in his father. No Known Allergies    Review of Systems  Constitutional: Negative for fever and appetite change.  Cardiovascular: Negative for chest pain.  Gastrointestinal: Positive for constipation. Negative for nausea, vomiting, abdominal pain, blood in stool and abdominal distention.       Objective:   Physical Exam  Constitutional: He appears well-developed and well-nourished. No distress.  Cardiovascular: Normal rate, regular rhythm and normal heart sounds.   Pulmonary/Chest: Effort normal and breath sounds normal. No respiratory distress. He has no wheezes. He has no rales.  Abdominal:       Patient has bag right lower abdomen from recent cystectomy.  Abdomen nondistended. Nontender. No masses rectal exam reveals large amount soft stool rectal vault but no significant impaction.  No rectal mass          Assessment & Plan:  Constipation. Educational sheet given. Increase fluids and fiber. Plenty of activity such as walking. Add Senokot. Patient will try  Fleet enema

## 2011-08-08 NOTE — Patient Instructions (Signed)
Constipation in Adults Constipation is having fewer than 2 bowel movements per week. Usually, the stools are hard. As we grow older, constipation is more common. If you try to fix constipation with laxatives, the problem may get worse. This is because laxatives taken over a long period of time make the colon muscles weaker. A low-fiber diet, not taking in enough fluids, and taking some medicines may make these problems worse. MEDICATIONS THAT MAY CAUSE CONSTIPATION  Water pills (diuretics).  Calcium channel blockers (used to control blood pressure and for the heart).   Certain pain medicines (narcotics).   Anticholinergics.  Anti-inflammatory agents.   Antacids that contain aluminum.   DISEASES THAT CONTRIBUTE TO CONSTIPATION  Diabetes.  Parkinson's disease.   Dementia.   Stroke.  Depression.   Illnesses that cause problems with salt and water metabolism.   HOME CARE INSTRUCTIONS  Constipation is usually best cared for without medicines. Increasing dietary fiber and eating more fruits and vegetables is the best way to manage constipation.   Slowly increase fiber intake to 25 to 38 grams per day. Whole grains, fruits, vegetables, and legumes are good sources of fiber. A dietitian can further help you incorporate high-fiber foods into your diet.   Drink enough water and fluids to keep your urine clear or pale yellow.   A fiber supplement may be added to your diet if you cannot get enough fiber from foods.   Increasing your activities also helps improve regularity.   Suppositories, as suggested by your caregiver, will also help. If you are using antacids, such as aluminum or calcium containing products, it will be helpful to switch to products containing magnesium if your caregiver says it is okay.   If you have been given a liquid injection (enema) today, this is only a temporary measure. It should not be relied on for treatment of longstanding (chronic) constipation.    Stronger measures, such as magnesium sulfate, should be avoided if possible. This may cause uncontrollable diarrhea. Using magnesium sulfate may not allow you time to make it to the bathroom.  SEEK IMMEDIATE MEDICAL CARE IF:  There is bright red blood in the stool.   The constipation stays for more than 4 days.   There is belly (abdominal) or rectal pain.   You do not seem to be getting better.   You have any questions or concerns.  MAKE SURE YOU:  Understand these instructions.   Will watch your condition.   Will get help right away if you are not doing well or get worse.  Document Released: 08/03/2004 Document Re-Released: 01/30/2010 [facility] Patient Information 2011 [place], [place].  Consider Senokot (stool softener) Consider Fleet enema or glycerin suppository. Keep walking! Plenty of fluids.

## 2011-10-17 ENCOUNTER — Other Ambulatory Visit: Payer: Self-pay | Admitting: Urology

## 2011-10-17 DIAGNOSIS — C679 Malignant neoplasm of bladder, unspecified: Secondary | ICD-10-CM

## 2011-10-29 ENCOUNTER — Inpatient Hospital Stay (HOSPITAL_COMMUNITY): Admission: RE | Admit: 2011-10-29 | Payer: Medicare Other | Source: Ambulatory Visit

## 2011-11-28 ENCOUNTER — Ambulatory Visit (INDEPENDENT_AMBULATORY_CARE_PROVIDER_SITE_OTHER): Payer: Medicare Other | Admitting: Family Medicine

## 2011-11-28 ENCOUNTER — Encounter: Payer: Self-pay | Admitting: Family Medicine

## 2011-11-28 VITALS — BP 140/70 | Temp 98.4°F | Wt 170.0 lb

## 2011-11-28 DIAGNOSIS — I10 Essential (primary) hypertension: Secondary | ICD-10-CM

## 2011-11-28 DIAGNOSIS — R21 Rash and other nonspecific skin eruption: Secondary | ICD-10-CM

## 2011-11-28 MED ORDER — TRIAMCINOLONE ACETONIDE 0.1 % EX CREA: TOPICAL_CREAM | Freq: Two times a day (BID) | CUTANEOUS | Status: AC

## 2011-11-28 NOTE — Progress Notes (Signed)
  Subjective:    Patient ID: Walter Patrick, male    DOB: [birth year], 78 y.o.   MRN: [id number]  HPI  Patient complains some skin irritation. History of bladder cancer with itching around colostomy site. He has some dry skin but mostly irritation from adhesive. Denies any fever or chills. Fair appetite. Also complaining of some bilateral knee pains. Long history arthritis.  Hypertension treated with amlodipine. Blood pressure relatively stable   Review of Systems  Constitutional: Negative for fever, chills, appetite change and unexpected weight change.  Respiratory: Negative for cough.   Cardiovascular: Negative for chest pain.  Gastrointestinal: Negative for abdominal distention.  Skin: Positive for rash.       Objective:   Physical Exam  Constitutional: He appears well-developed and well-nourished.  Cardiovascular: Normal rate and regular rhythm.   Pulmonary/Chest: Effort normal and breath sounds normal. No respiratory distress. He has no wheezes. He has no rales.  Abdominal:       Patient has some mild erythema around his ostomy site from adhesive. There is no warmth or tenderness.          Assessment & Plan:  #1 allergic skin rash. Slight irritation from adhesive. Will check with urologist. Triamcinolone 0.1% cream twice a day for symptom relief. #2 hypertension stable continue current medication

## 2011-12-24 ENCOUNTER — Telehealth: Payer: Self-pay | Admitting: *Deleted

## 2011-12-24 NOTE — Telephone Encounter (Signed)
Dr Beverely Low.  OK to set up referral.

## 2011-12-24 NOTE — Telephone Encounter (Signed)
Painful shoulder and hand and [facility] does take his insurance- who do you suggest and can we make appointment?

## 2011-12-25 NOTE — Telephone Encounter (Signed)
I misinformed you

## 2011-12-25 NOTE — Telephone Encounter (Signed)
Thanks deb

## 2011-12-25 NOTE — Telephone Encounter (Signed)
Try [facility], Dr Sherlean Foot.

## 2011-12-25 NOTE — Telephone Encounter (Signed)
Left detailed message on pt's voice mail

## 2011-12-25 NOTE — Telephone Encounter (Signed)
I am sorry- [facility] does not take his insurance--

## 2012-01-03 ENCOUNTER — Ambulatory Visit (INDEPENDENT_AMBULATORY_CARE_PROVIDER_SITE_OTHER): Payer: Medicare Other | Admitting: Family Medicine

## 2012-01-03 ENCOUNTER — Encounter: Payer: Self-pay | Admitting: Family Medicine

## 2012-01-03 VITALS — BP 160/72 | Temp 97.7°F | Wt 173.0 lb

## 2012-01-03 DIAGNOSIS — M2550 Pain in unspecified joint: Secondary | ICD-10-CM

## 2012-01-03 DIAGNOSIS — I10 Essential (primary) hypertension: Secondary | ICD-10-CM

## 2012-01-03 LAB — CBC WITH DIFFERENTIAL/PLATELET
Basophils Absolute: 0 10*3/uL (ref 0.0–0.1)
Eosinophils Absolute: 0.1 10*3/uL (ref 0.0–0.7)
HCT: 41.4 % (ref 39.0–52.0)
Hemoglobin: 13.8 g/dL (ref 13.0–17.0)
Lymphocytes Relative: 21.9 % (ref 12.0–46.0)
Lymphs Abs: 1.2 10*3/uL (ref 0.7–4.0)
MCHC: 33.3 g/dL (ref 30.0–36.0)
Monocytes Absolute: 0.6 10*3/uL (ref 0.1–1.0)
Neutro Abs: 3.4 10*3/uL (ref 1.4–7.7)
RDW: 15.3 % — ABNORMAL HIGH (ref 11.5–14.6)

## 2012-01-03 MED ORDER — AMLODIPINE BESYLATE 10 MG PO TABS: 10.0000 mg | ORAL_TABLET | Freq: Every day | ORAL | Status: DC

## 2012-01-03 NOTE — Progress Notes (Signed)
  Subjective:    Patient ID: Walter Patrick, male    DOB: [birth year], 78 y.o.   MRN: [id number]  HPI  Patient in for medical followup. He has had some increased arthralgias which started about a month ago. He has symmetric arthritis wrists shoulders knees. No erythema, warmth, or edema. Using Advil 2 tablets twice daily with some relief. No edema. No rashes. Pain is moderate. No aggravating factors. He continues to do a lot of physical work daily. Question of rheumatoid arthritis in daughter. Denies any injury.  Hypertension treated with amlodipine 5 mg daily. Has increased blood pressure. No side effect from medication.  Past Medical History  Diagnosis Date  . ESSENTIAL HYPERTENSION 11/30/2010  . SKIN CANCER, HX OF 11/30/2010  . Arthritis   . Cancer     bladder   Past Surgical History  Procedure Date  . Cystectomy 06/27/11    reports that he has never smoked. He does not have any smokeless tobacco history on file. He reports that he drinks alcohol. His drug history not on file. family history includes Heart disease in his father. No Known Allergies    Review of Systems  Constitutional: Negative for fatigue.  Eyes: Negative for visual disturbance.  Respiratory: Negative for cough, chest tightness and shortness of breath.   Cardiovascular: Negative for chest pain, palpitations and leg swelling.  Musculoskeletal: Positive for arthralgias. Negative for back pain.  Skin: Negative for rash.  Neurological: Negative for dizziness, syncope, weakness, light-headedness and headaches.       Objective:   Physical Exam  Constitutional: He appears well-developed and well-nourished.  Cardiovascular: Normal rate and regular rhythm.   Murmur heard. Pulmonary/Chest: Effort normal and breath sounds normal. No respiratory distress. He has no wheezes. He has no rales.  Musculoskeletal: He exhibits no edema.       No evidence for a visible joint edema, erythema, or warmth.          Assessment  & Plan:  #1 hypertension. Suboptimal control. Increase amlodipine 10 mg daily and reassess 2 weeks #2 symmetric polyarthralgias involving wrists, shoulders, and knees. No objective evidence for inflammation. Given acute onset of pain screening lab work. Continue Advil. Reassess 2 weeks

## 2012-01-08 NOTE — Progress Notes (Signed)
Quick Note:  Attempt to call- VM - LMTCB - informed of dr. Caryl Never instructions ______

## 2012-01-10 NOTE — Progress Notes (Signed)
Quick Note:  Pt informed again on home VM ______

## 2012-01-17 ENCOUNTER — Encounter: Payer: Self-pay | Admitting: Family Medicine

## 2012-01-17 ENCOUNTER — Ambulatory Visit (INDEPENDENT_AMBULATORY_CARE_PROVIDER_SITE_OTHER): Payer: Medicare Other | Admitting: Family Medicine

## 2012-01-17 DIAGNOSIS — I10 Essential (primary) hypertension: Secondary | ICD-10-CM

## 2012-01-17 DIAGNOSIS — M159 Polyosteoarthritis, unspecified: Secondary | ICD-10-CM

## 2012-01-17 NOTE — Progress Notes (Signed)
  Subjective:    Patient ID: Walter Patrick, male    DOB: 1932-03-18, 78 y.o.   MRN: [id number]  HPI  Patient here for followup hypertension. We titrated amlodipine to 10 mg last visit. No headaches or edema issues. Blood pressures fairly well controlled by home readings. No orthostatic symptoms. Currently patient takes only amlodipine 10 mg daily.  Recent surgery for bladder cancer. 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 He's had progressive arthritis in multiple joints especially left knee. Requesting orthopedic referral. We obtained screening labs last visit which did not show any evidence for inflammatory arthritis. His left knee has become more debilitating over time. Has taken various anti-inflammatories without much improvement  Past Medical History  Diagnosis Date  . ESSENTIAL HYPERTENSION 11/30/2010  . SKIN CANCER, HX OF 11/30/2010  . Arthritis   . Cancer     bladder   Past Surgical History  Procedure Date  . Cystectomy 06/27/11    reports that he has never smoked. He does not have any smokeless tobacco history on file. He reports that he drinks alcohol. His drug history not on file. family history includes Heart disease in his father. No Known Allergies    Review of Systems  Constitutional: Negative for fatigue.  Eyes: Negative for visual disturbance.  Respiratory: Negative for cough, chest tightness and shortness of breath.   Cardiovascular: Negative for chest pain, palpitations and leg swelling.  Neurological: Negative for dizziness, syncope, weakness, light-headedness and headaches.       Objective:   Physical Exam  Constitutional: He appears well-developed and well-nourished.  Cardiovascular: Normal rate and regular rhythm.   Pulmonary/Chest: Effort normal and breath sounds normal. No respiratory distress. He has no wheezes. He has no rales.  Musculoskeletal:       Crepitus with flexion extension left knee. No effusion. No warmth          Assessment &  Plan:  #1 hypertension improved. Continue increased dose amlodipine 10 mg daily. Reassess in 3 months #2 osteoarthritis multiple joints, especially left knee. Orthopedic referral per patient request.

## 2012-03-19 ENCOUNTER — Other Ambulatory Visit: Payer: Self-pay | Admitting: Urology

## 2012-03-19 DIAGNOSIS — N1330 Unspecified hydronephrosis: Secondary | ICD-10-CM

## 2012-03-19 DIAGNOSIS — Z906 Acquired absence of other parts of urinary tract: Secondary | ICD-10-CM

## 2012-03-31 ENCOUNTER — Ambulatory Visit (HOSPITAL_COMMUNITY)
Admission: RE | Admit: 2012-03-31 | Discharge: 2012-03-31 | Disposition: A | Discharge status: Home / Self Care | Payer: Medicare Other | Source: Ambulatory Visit | Attending: Urology | Admitting: Urology

## 2012-03-31 DIAGNOSIS — C679 Malignant neoplasm of bladder, unspecified: Secondary | ICD-10-CM | POA: Insufficient documentation

## 2012-03-31 DIAGNOSIS — Z906 Acquired absence of other parts of urinary tract: Secondary | ICD-10-CM | POA: Insufficient documentation

## 2012-03-31 DIAGNOSIS — N1330 Unspecified hydronephrosis: Secondary | ICD-10-CM | POA: Insufficient documentation

## 2012-03-31 MED ORDER — IOHEXOL 300 MG/ML  SOLN
150.0000 mL | Freq: Once | INTRAMUSCULAR | Status: AC | PRN
  Administered 2012-03-31: 50 mL via INTRAVENOUS

## 2012-04-15 ENCOUNTER — Ambulatory Visit: Payer: Medicare Other | Admitting: Family Medicine

## 2012-09-08 ENCOUNTER — Encounter: Payer: Self-pay | Admitting: Family Medicine

## 2012-09-08 ENCOUNTER — Ambulatory Visit (INDEPENDENT_AMBULATORY_CARE_PROVIDER_SITE_OTHER): Payer: Medicare Other | Admitting: Family Medicine

## 2012-09-08 VITALS — BP 130/70 | Temp 97.9°F | Wt 166.0 lb

## 2012-09-08 DIAGNOSIS — R8290 Unspecified abnormal findings in urine: Secondary | ICD-10-CM

## 2012-09-08 DIAGNOSIS — R531 Weakness: Secondary | ICD-10-CM

## 2012-09-08 DIAGNOSIS — R6883 Chills (without fever): Secondary | ICD-10-CM

## 2012-09-08 DIAGNOSIS — R82998 Other abnormal findings in urine: Secondary | ICD-10-CM

## 2012-09-08 DIAGNOSIS — R5383 Other fatigue: Secondary | ICD-10-CM

## 2012-09-08 LAB — POCT URINALYSIS DIPSTICK
Ketones, UA: NEGATIVE
Nitrite, UA: NEGATIVE
pH, UA: 8.5

## 2012-09-08 NOTE — Progress Notes (Signed)
  Subjective:    Patient ID: Walter Patrick, male    DOB: 1932/03/18, 78 y.o.   MRN: [id number]  HPI  Acute visit. Patient was concerned about possible food poisoning last Thursday night. He ate some hot dogs but these were thoroughly cooked. This around 6 PM.  Around 4 AM he woke up with what sounds like shaking chill. Temperature not taken. He had what sounds like about 3 loose stools with no bloody stools. He never had any nausea or vomiting. Some decreased appetite over the weekend. No abdominal pain. No diarrhea since then but has had some increased malaise.  History of cystectomy secondary to bladder cancer. He has ostomy with bag and noted cloudy appearing urine over the past couple days but more clear today. Denies any recent headaches, sinus congestive symptoms, or cough. Some generalized weakness but feels slightly better today  Past Medical History  Diagnosis Date  . ESSENTIAL HYPERTENSION 11/30/2010  . SKIN CANCER, HX OF 11/30/2010  . Arthritis   . Cancer     bladder   Past Surgical History  Procedure Date  . Cystectomy 06/27/11    reports that he has never smoked. He does not have any smokeless tobacco history on file. He reports that he drinks alcohol. His drug history not on file. family history includes Heart disease in his father. No Known Allergies    Review of Systems  Constitutional: Positive for chills and fatigue.  HENT: Negative for congestion.   Respiratory: Negative for cough and shortness of breath.   Cardiovascular: Negative for chest pain.  Gastrointestinal: Negative for abdominal pain.  Skin: Negative for rash.  Neurological: Negative for dizziness and headaches.       Objective:   Physical Exam  Constitutional: He appears well-developed and well-nourished.  HENT:  Right Ear: External ear normal.  Left Ear: External ear normal.  Mouth/Throat: Oropharynx is clear and moist.  Cardiovascular: Normal rate and regular rhythm.   Pulmonary/Chest: Effort  normal and breath sounds normal. No respiratory distress. He has no wheezes. He has no rales.  Abdominal: Soft. There is no tenderness. There is no rebound and no guarding.       Patient has external bag with clear urine          Assessment & Plan:  Patient presents with onset late last week of malaise, chills, and generalized weakness. Report of cloudy urine which is now more clear.  Doubt "food poisoning" by history. Check labs with basic metabolic panel and CBC. Check urine dipstick.

## 2012-09-08 NOTE — Patient Instructions (Signed)
Follow up promptly for any fever, chills, or increased weakness.

## 2012-09-09 ENCOUNTER — Telehealth: Payer: Self-pay | Admitting: Family Medicine

## 2012-09-09 LAB — CBC WITH DIFFERENTIAL/PLATELET
Basophils Relative: 0.1 % (ref 0.0–3.0)
Eosinophils Absolute: 0 10*3/uL (ref 0.0–0.7)
HCT: 44.7 % (ref 39.0–52.0)
Hemoglobin: 14.7 g/dL (ref 13.0–17.0)
MCHC: 33 g/dL (ref 30.0–36.0)
MCV: 99.4 fl (ref 78.0–100.0)
Monocytes Absolute: 1.1 10*3/uL — ABNORMAL HIGH (ref 0.1–1.0)
Neutro Abs: 6.2 10*3/uL (ref 1.4–7.7)
RBC: 4.5 Mil/uL (ref 4.22–5.81)

## 2012-09-09 LAB — BASIC METABOLIC PANEL
BUN: 29 mg/dL — ABNORMAL HIGH (ref 6–23)
Creatinine, Ser: 1 mg/dL (ref 0.4–1.5)
GFR: 73.31 mL/min (ref >60.00)

## 2012-09-09 NOTE — Telephone Encounter (Signed)
Pt would like blood work results °

## 2012-09-10 NOTE — Progress Notes (Signed)
Quick Note:  Pt informed on VM ______ 

## 2012-09-10 NOTE — Telephone Encounter (Signed)
Pt informed on only phone number all labs OK

## 2013-01-16 ENCOUNTER — Other Ambulatory Visit: Payer: Self-pay | Admitting: Family Medicine

## 2013-03-27 ENCOUNTER — Observation Stay (HOSPITAL_COMMUNITY): Payer: No Typology Code available for payment source

## 2013-03-27 ENCOUNTER — Other Ambulatory Visit: Payer: Self-pay

## 2013-03-27 ENCOUNTER — Inpatient Hospital Stay (HOSPITAL_COMMUNITY)
Admission: EM | Admit: 2013-03-27 | Discharge: 2013-03-30 | DRG: 312 | Disposition: A | Discharge status: Home Health | Payer: No Typology Code available for payment source | Attending: Internal Medicine | Admitting: Internal Medicine

## 2013-03-27 ENCOUNTER — Encounter (HOSPITAL_COMMUNITY): Payer: Self-pay | Admitting: Emergency Medicine

## 2013-03-27 DIAGNOSIS — M542 Cervicalgia: Secondary | ICD-10-CM

## 2013-03-27 DIAGNOSIS — M1710 Unilateral primary osteoarthritis, unspecified knee: Secondary | ICD-10-CM | POA: Diagnosis present

## 2013-03-27 DIAGNOSIS — R55 Syncope and collapse: Principal | ICD-10-CM

## 2013-03-27 DIAGNOSIS — Z79899 Other long term (current) drug therapy: Secondary | ICD-10-CM

## 2013-03-27 DIAGNOSIS — C679 Malignant neoplasm of bladder, unspecified: Secondary | ICD-10-CM

## 2013-03-27 DIAGNOSIS — N390 Urinary tract infection, site not specified: Secondary | ICD-10-CM | POA: Diagnosis present

## 2013-03-27 DIAGNOSIS — I10 Essential (primary) hypertension: Secondary | ICD-10-CM

## 2013-03-27 DIAGNOSIS — G459 Transient cerebral ischemic attack, unspecified: Secondary | ICD-10-CM

## 2013-03-27 DIAGNOSIS — Z8551 Personal history of malignant neoplasm of bladder: Secondary | ICD-10-CM

## 2013-03-27 DIAGNOSIS — D72829 Elevated white blood cell count, unspecified: Secondary | ICD-10-CM

## 2013-03-27 DIAGNOSIS — M159 Polyosteoarthritis, unspecified: Secondary | ICD-10-CM | POA: Diagnosis present

## 2013-03-27 DIAGNOSIS — Z85828 Personal history of other malignant neoplasm of skin: Secondary | ICD-10-CM

## 2013-03-27 DIAGNOSIS — Z7982 Long term (current) use of aspirin: Secondary | ICD-10-CM

## 2013-03-27 LAB — VITAMIN B12: Vitamin B-12: 427 pg/mL (ref 211–911)

## 2013-03-27 LAB — BASIC METABOLIC PANEL
BUN: 15 mg/dL (ref 6–23)
Calcium: 9.1 mg/dL (ref 8.4–10.5)
Chloride: 103 mEq/L (ref 96–112)
Creatinine, Ser: 0.93 mg/dL (ref 0.50–1.35)
GFR calc Af Amer: >90 mL/min (ref >90)
GFR calc non Af Amer: 78 mL/min — ABNORMAL LOW (ref >90)

## 2013-03-27 LAB — URINE MICROSCOPIC-ADD ON

## 2013-03-27 LAB — URINALYSIS, ROUTINE W REFLEX MICROSCOPIC
Bilirubin Urine: NEGATIVE
Glucose, UA: NEGATIVE mg/dL
Ketones, ur: 40 mg/dL — AB
Protein, ur: NEGATIVE mg/dL
Urobilinogen, UA: 0.2 mg/dL (ref 0.0–1.0)

## 2013-03-27 LAB — CREATININE, SERUM
GFR calc Af Amer: >90 mL/min (ref >90)
GFR calc non Af Amer: 78 mL/min — ABNORMAL LOW (ref >90)

## 2013-03-27 LAB — CBC WITH DIFFERENTIAL/PLATELET
Basophils Absolute: 0 10*3/uL (ref 0.0–0.1)
Basophils Relative: 0 % (ref 0–1)
Eosinophils Absolute: 0 10*3/uL (ref 0.0–0.7)
HCT: 39 % (ref 39.0–52.0)
MCH: 32.9 pg (ref 26.0–34.0)
MCHC: 35.1 g/dL (ref 30.0–36.0)
Monocytes Absolute: 1.2 10*3/uL — ABNORMAL HIGH (ref 0.1–1.0)
Neutro Abs: 14.3 10*3/uL — ABNORMAL HIGH (ref 1.7–7.7)
RDW: 12.8 % (ref 11.5–15.5)

## 2013-03-27 LAB — POCT I-STAT TROPONIN I: Troponin i, poc: 0 ng/mL (ref 0.00–0.08)

## 2013-03-27 LAB — CBC
HCT: 40 % (ref 39.0–52.0)
Hemoglobin: 14.2 g/dL (ref 13.0–17.0)
MCV: 93.5 fL (ref 78.0–100.0)
RBC: 4.28 MIL/uL (ref 4.22–5.81)
WBC: 15.2 10*3/uL — ABNORMAL HIGH (ref 4.0–10.5)

## 2013-03-27 LAB — TSH: TSH: 0.923 u[IU]/mL (ref 0.350–4.500)

## 2013-03-27 LAB — HEMOGLOBIN A1C
Hgb A1c MFr Bld: 4.9 % (ref <5.7)
Mean Plasma Glucose: 94 mg/dL (ref <117)

## 2013-03-27 LAB — TROPONIN I: Troponin I: <0.3 ng/mL (ref <0.30)

## 2013-03-27 MED ORDER — ASPIRIN 81 MG PO CHEW
81.0000 mg | CHEWABLE_TABLET | Freq: Every day | ORAL | Status: DC
  Administered 2013-03-27 – 2013-03-30 (×4): 81 mg via ORAL
  Filled 2013-03-27 (×4): qty 1

## 2013-03-27 MED ORDER — HEPARIN SODIUM (PORCINE) 5000 UNIT/ML IJ SOLN
5000.0000 [IU] | Freq: Three times a day (TID) | INTRAMUSCULAR | Status: DC
  Administered 2013-03-27 – 2013-03-30 (×10): 5000 [IU] via SUBCUTANEOUS
  Filled 2013-03-27 (×12): qty 1

## 2013-03-27 MED ORDER — SODIUM CHLORIDE 0.9 % IV SOLN
INTRAVENOUS | Status: DC
  Administered 2013-03-27 – 2013-03-28 (×2): via INTRAVENOUS
  Administered 2013-03-28: 1000 mL via INTRAVENOUS

## 2013-03-27 MED ORDER — HYDROCODONE-ACETAMINOPHEN 5-325 MG PO TABS
1.0000 | ORAL_TABLET | Freq: Four times a day (QID) | ORAL | Status: DC | PRN
  Administered 2013-03-27 – 2013-03-30 (×7): 1 via ORAL
  Filled 2013-03-27 (×7): qty 1

## 2013-03-27 MED ORDER — ACETAMINOPHEN 325 MG PO TABS: 650.0000 mg | ORAL_TABLET | Freq: Four times a day (QID) | ORAL | Status: DC | PRN

## 2013-03-27 MED ORDER — HYDRALAZINE HCL 20 MG/ML IJ SOLN: 5.0000 mg | Freq: Four times a day (QID) | INTRAMUSCULAR | Status: DC | PRN

## 2013-03-27 NOTE — ED Notes (Signed)
Skin tear noted to posterior left hand.
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
## 2013-03-28 DIAGNOSIS — N390 Urinary tract infection, site not specified: Secondary | ICD-10-CM

## 2013-03-28 LAB — BASIC METABOLIC PANEL
BUN: 15 mg/dL (ref 6–23)
CO2: 25 mEq/L (ref 19–32)
Calcium: 8.6 mg/dL (ref 8.4–10.5)
GFR calc non Af Amer: 79 mL/min — ABNORMAL LOW (ref >90)
Glucose, Bld: 119 mg/dL — ABNORMAL HIGH (ref 70–99)
Sodium: 138 mEq/L (ref 135–145)

## 2013-03-28 LAB — LIPID PANEL
Cholesterol: 169 mg/dL (ref 0–200)
Triglycerides: 98 mg/dL (ref <150)
VLDL: 20 mg/dL (ref 0–40)

## 2013-03-28 LAB — CBC
HCT: 36.8 % — ABNORMAL LOW (ref 39.0–52.0)
Hemoglobin: 12.7 g/dL — ABNORMAL LOW (ref 13.0–17.0)
MCH: 32.4 pg (ref 26.0–34.0)
MCHC: 34.5 g/dL (ref 30.0–36.0)
MCV: 93.9 fL (ref 78.0–100.0)
RBC: 3.92 MIL/uL — ABNORMAL LOW (ref 4.22–5.81)

## 2013-03-28 LAB — TROPONIN I: Troponin I: <0.3 ng/mL (ref <0.30)

## 2013-03-28 LAB — RAPID URINE DRUG SCREEN, HOSP PERFORMED: Benzodiazepines: NOT DETECTED

## 2013-03-28 MED ORDER — DEXTROSE 5 % IV SOLN
1.0000 g | INTRAVENOUS | Status: DC
  Administered 2013-03-28 – 2013-03-30 (×3): 1 g via INTRAVENOUS
  Filled 2013-03-28 (×4): qty 10
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
## 2013-03-28 NOTE — Progress Notes (Signed)
Utilization Review Completed.   Nioma Mccubbins, RN, BSN Nurse Case Manager  [phone]  

## 2013-03-28 NOTE — Progress Notes (Signed)
  Echocardiogram 2D Echocardiogram has been performed.  Willeen Novak 03/28/2013, 1:31 PM
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
## 2013-03-29 DIAGNOSIS — R55 Syncope and collapse: Secondary | ICD-10-CM

## 2013-03-29 LAB — BASIC METABOLIC PANEL
BUN: 11 mg/dL (ref 6–23)
Chloride: 103 mEq/L (ref 96–112)
Creatinine, Ser: 0.81 mg/dL (ref 0.50–1.35)
GFR calc Af Amer: >90 mL/min (ref >90)
GFR calc non Af Amer: 82 mL/min — ABNORMAL LOW (ref >90)
Glucose, Bld: 108 mg/dL — ABNORMAL HIGH (ref 70–99)
Potassium: 3.5 mEq/L (ref 3.5–5.1)

## 2013-03-29 LAB — CBC
HCT: 36.1 % — ABNORMAL LOW (ref 39.0–52.0)
Hemoglobin: 12.2 g/dL — ABNORMAL LOW (ref 13.0–17.0)
MCH: 32.2 pg (ref 26.0–34.0)
MCHC: 33.8 g/dL (ref 30.0–36.0)
RDW: 13.2 % (ref 11.5–15.5)

## 2013-03-29 MED ORDER — AMLODIPINE BESYLATE 5 MG PO TABS
5.0000 mg | ORAL_TABLET | Freq: Every day | ORAL | Status: DC
  Administered 2013-03-29 – 2013-03-30 (×2): 5 mg via ORAL
  Filled 2013-03-29 (×2): qty 1

## 2013-03-29 MED ORDER — POTASSIUM CHLORIDE CRYS ER 20 MEQ PO TBCR
40.0000 meq | EXTENDED_RELEASE_TABLET | Freq: Once | ORAL | Status: AC
  Administered 2013-03-29: 40 meq via ORAL
  Filled 2013-03-29: qty 2
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
## 2013-03-29 NOTE — Progress Notes (Signed)
VASCULAR LAB PRELIMINARY  PRELIMINARY  PRELIMINARY  PRELIMINARY  Carotid duplex  completed.    Preliminary report:  Bilateral:  No evidence of hemodynamically significant internal carotid artery stenosis.   Vertebral artery flow is antegrade.      Walter Patrick, RVT 03/29/2013, 11:19 AM

## 2013-03-29 NOTE — Plan of Care (Signed)
Problem: Phase I Progression Outcomes Goal: Voiding-avoid urinary catheter unless indicated Outcome: Not Applicable Date Met:  03/29/13 Pt has urostomy.

## 2013-03-30 ENCOUNTER — Ambulatory Visit: Payer: Medicare Other | Admitting: Family Medicine

## 2013-03-30 ENCOUNTER — Inpatient Hospital Stay (HOSPITAL_COMMUNITY): Payer: No Typology Code available for payment source

## 2013-03-30 DIAGNOSIS — R55 Syncope and collapse: Secondary | ICD-10-CM

## 2013-03-30 LAB — BASIC METABOLIC PANEL
BUN: 15 mg/dL (ref 6–23)
Calcium: 9.5 mg/dL (ref 8.4–10.5)
GFR calc Af Amer: >90 mL/min (ref >90)
GFR calc non Af Amer: 83 mL/min — ABNORMAL LOW (ref >90)
Glucose, Bld: 91 mg/dL (ref 70–99)

## 2013-03-30 LAB — URINE CULTURE: Colony Count: >=100000

## 2013-03-30 LAB — CBC
MCH: 32.2 pg (ref 26.0–34.0)
MCHC: 34 g/dL (ref 30.0–36.0)
Platelets: 214 10*3/uL (ref 150–400)
RDW: 13.1 % (ref 11.5–15.5)

## 2013-03-30 MED ORDER — AMLODIPINE BESYLATE 5 MG PO TABS: 5.0000 mg | ORAL_TABLET | Freq: Every day | ORAL | Status: DC

## 2013-03-30 MED ORDER — ASPIRIN 81 MG PO CHEW: 81.0000 mg | CHEWABLE_TABLET | Freq: Every day | ORAL | Status: DC

## 2013-03-30 MED ORDER — HYDROCODONE-ACETAMINOPHEN 5-325 MG PO TABS: 1.0000 | ORAL_TABLET | Freq: Four times a day (QID) | ORAL | Status: DC | PRN
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
## 2013-03-30 NOTE — Progress Notes (Signed)
Routine EEG completed.  

## 2013-03-30 NOTE — Progress Notes (Signed)
Pt and daughter-n-law given D/C instructions with Rx's, verbal understanding given. Pt D/C'd home via wheelchair @ 1835 per MD order. Rema Fendt, RN
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
## 2013-04-01 NOTE — Procedures (Signed)
EEG NUMBER:  REFERRING PHYSICIAN:  Rosanna Randy, MD.  HISTORY:  A 79 year old male with syncope, evaluate to rule out seizure.  MEDICATIONS:  Norvasc, aspirin, Rocephin, heparin.  CONDITIONS OF RECORDING:  This is a 16-channel EEG carried out with the patient in the awake state.  DESCRIPTION:  The waking background activity consists of a low-voltage, symmetrical, fairly well-organized, 9 hertz alpha activity seen from the parieto-occipital and posterotemporal regions.  Low-voltage, fast activity, poorly organized is seen anteriorly, and at times, superimposed on more posterior rhythms.  A mixture of theta and alpha was seen from the central and temporal regions.  The patient does not drowse or sleep.  Hypoventilation was not performed.  Intermittent photic stimulation failed to elicit any change in the tracing.  IMPRESSION:  This is a normal awake EEG.  COMMENT:  In the EEG, the patient is sleep deprived to elicit drowse and light sleep may be desirable to further elicit possible seizure disorder.          ______________________________ Thana Farr, MD    LK:GMWN D:  03/31/2013 07:51:36  T:  04/01/2013 00:09:42  Job #:  [id number]

## 2013-04-02 NOTE — Care Management Note (Signed)
    Page 1 of 2   04/02/2013     4:32:34 PM   CARE MANAGEMENT NOTE 04/02/2013  Patient:  Walter Patrick, Walter Patrick   Account Number:  [id number]  Date Initiated:  03/30/2013  Documentation initiated by:  [facility]  Subjective/Objective Assessment:   admitted with syncope, UTI     Action/Plan:   Pt/OT evals- recommended HHPT, HHOT and rolling walker   Anticipated DC Date:  03/31/2013   Anticipated DC Plan:  HOME W HOME HEALTH SERVICES      DC Planning Services  CM consult      Choice offered to / List presented to:  C-1 Patient   DME arranged  Levan Hurst      DME agency  [facility]        Status of service:  Completed, signed off Medicare Important Message given?   (If response is "NO", the following Medicare IM given date fields will be blank) Date Medicare IM given:   Date Additional Medicare IM given:    Discharge Disposition:  HOME/SELF CARE  Per UR Regulation:  Reviewed for med. necessity/level of care/duration of stay  If discussed at Long Length of Stay Meetings, dates discussed:    Comments:  04/02/13 Called patient, he stated that he is feeling much better today and has an appointment with his PCP Dr. Lodema Hong 04/06/13. He stated that he wantede to wait until after his PCP appt to decide if he wants HHC. I explained that his PCP would need to decide if patient still needed HHC and would need to order it at that time. Patient stated that he understood and that he was fine with that. He thanked me for calling. Jacquelynn Cree RN, BSN, CCM  04/01/13 Called patient, he sttaed thathis back was hurting alot today and that he will be calling his PCP. He did not want to set up HHC. Told him I would call back on 04/02/13 to check with him. Jacquelynn Cree RN, BSN, CCM  03/31/13 Called patient's daughter, she stated that she is not familiar with any HHC agencies. Spoke with patient by phone, he stated that he wanted to speak with his other daugher about [facility] and would call  me back on 04/01/13 with agency choice. Will continue to follow. Jacquelynn Cree RN, BSN, CCM  03/30/13 Spoke with patient about HHC. He is agreeable to [facility] but would like to speak with his daughter who is a Charity fundraiser before selecting an agency.Gave patient [facility] agencies list. Patient states that he lives alone but his daughter lives across the street and his son lives next door.Will continue to follow. Jacquelynn Cree RN, BSN, CCM

## 2013-04-06 ENCOUNTER — Ambulatory Visit (INDEPENDENT_AMBULATORY_CARE_PROVIDER_SITE_OTHER): Payer: Medicare Other | Admitting: Family Medicine

## 2013-04-06 ENCOUNTER — Encounter: Payer: Self-pay | Admitting: Family Medicine

## 2013-04-06 VITALS — BP 142/62 | Temp 98.3°F | Wt 162.0 lb

## 2013-04-06 DIAGNOSIS — I10 Essential (primary) hypertension: Secondary | ICD-10-CM

## 2013-04-06 DIAGNOSIS — M549 Dorsalgia, unspecified: Secondary | ICD-10-CM

## 2013-04-06 DIAGNOSIS — R55 Syncope and collapse: Secondary | ICD-10-CM

## 2013-04-06 DIAGNOSIS — R229 Localized swelling, mass and lump, unspecified: Secondary | ICD-10-CM

## 2013-04-06 MED ORDER — AMLODIPINE BESYLATE 5 MG PO TABS: 5.0000 mg | ORAL_TABLET | Freq: Every day | ORAL | Status: AC

## 2013-04-06 MED ORDER — HYDROCODONE-ACETAMINOPHEN 5-325 MG PO TABS: 1.0000 | ORAL_TABLET | Freq: Four times a day (QID) | ORAL | Status: DC | PRN
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
## 2013-04-14 ENCOUNTER — Telehealth: Payer: Self-pay | Admitting: Family Medicine

## 2013-04-14 MED ORDER — HYDROCODONE-ACETAMINOPHEN 5-325 MG PO TABS: 1.0000 | ORAL_TABLET | Freq: Four times a day (QID) | ORAL | Status: DC | PRN

## 2013-04-14 NOTE — Telephone Encounter (Signed)
Hydrocodone last filled at hospital follow-up on 04-06-13

## 2013-04-14 NOTE — Telephone Encounter (Signed)
Pt needs refill of HYDROcodone-acetaminophen (NORCO/VICODIN) 5-325 MG per  Pharm: [facility]/ [address] [place]

## 2013-04-14 NOTE — Telephone Encounter (Signed)
Refill once but I would try to avoid prolonged use to avoid dependency.

## 2013-04-14 NOTE — Telephone Encounter (Signed)
Pt informed

## 2013-04-16 ENCOUNTER — Encounter: Payer: Self-pay | Admitting: *Deleted

## 2013-04-16 ENCOUNTER — Encounter: Payer: Self-pay | Admitting: Cardiovascular Disease

## 2013-04-16 ENCOUNTER — Ambulatory Visit (INDEPENDENT_AMBULATORY_CARE_PROVIDER_SITE_OTHER): Payer: Medicare Other | Admitting: Cardiovascular Disease

## 2013-04-16 VITALS — BP 130/70 | HR 92 | Ht 65.0 in | Wt 151.0 lb

## 2013-04-16 DIAGNOSIS — I10 Essential (primary) hypertension: Secondary | ICD-10-CM

## 2013-04-16 DIAGNOSIS — Z01818 Encounter for other preprocedural examination: Secondary | ICD-10-CM

## 2013-04-16 NOTE — Patient Instructions (Addendum)
Dr Allyson Sabal has ordered a lexiscan myoview to look at the bloodflow to your heart muscle.  I will contact you with the results of the test.

## 2013-04-16 NOTE — Progress Notes (Signed)
04/16/2013 Walter Patrick [facility]   April 21, 1932  [id number]  Primary Physician Kristian Covey, MD Primary Cardiologist: Runell Gess MD Roseanne Reno   HPI:  Walter Patrick is a 79 year old divorced Caucasian male father of 2, grandfather to 7 grandchildrenreferred by Dr. Caryl Never for preoperative cardiovascular clearance prior to elective total knee replacement by Dr. Valentina Gu. I saw him remotely in the office approximately 7 years ago. He works as a Surveyor, minerals. Factors include remote tobacco abuse and treated hypertension. His father died of an MI at 52. He's never had heart attack or stroke and denies chest pain or shortness of breath. He has a bladder cancer in 2012.   Current Outpatient Prescriptions  Medication Sig Dispense Refill  . amLODipine (NORVASC) 5 MG tablet Take 1 tablet (5 mg total) by mouth daily.  30 tablet  11  . aspirin 81 MG chewable tablet Chew 1 tablet (81 mg total) by mouth daily.      Marland Kitchen HYDROcodone-acetaminophen (NORCO/VICODIN) 5-325 MG per tablet Take 1 tablet by mouth every 6 (six) hours as needed.  30 tablet  0  . Naproxen Sodium (ALEVE) 220 MG CAPS Take 1 capsule by mouth 2 (two) times daily.       No current facility-administered medications for this visit.    No Known Allergies  History   Social History  . Marital Status: Single    Spouse Name: N/A    Number of Children: N/A  . Years of Education: N/A   Occupational History  . Not on file.   Social History Main Topics  . Smoking status: Never Smoker   . Smokeless tobacco: Not on file  . Alcohol Use: Yes  . Drug Use: Not on file  . Sexually Active: Not on file   Other Topics Concern  . Not on file   Social History Narrative  . No narrative on file     Review of Systems: General: negative for chills, fever, night sweats or weight changes.  Cardiovascular: negative for chest pain, dyspnea on exertion, edema, orthopnea, palpitations, paroxysmal nocturnal dyspnea or shortness of  breath Dermatological: negative for rash Respiratory: negative for cough or wheezing Urologic: negative for hematuria Abdominal: negative for nausea, vomiting, diarrhea, bright red blood per rectum, melena, or hematemesis Neurologic: negative for visual changes, syncope, or dizziness All other systems reviewed and are otherwise negative except as noted above.    Blood pressure 130/70, pulse 92, height 5\' 5"  (1.651 m), weight 151 lb (68.493 kg).  General appearance: alert and no distress Neck: no adenopathy, no carotid bruit, no JVD, supple, symmetrical, trachea midline and thyroid not enlarged, symmetric, no tenderness/mass/nodules Lungs: clear to auscultation bilaterally Heart: regular rate and rhythm, S1, S2 normal, no murmur, click, rub or gallop Abdomen: soft, non-tender; bowel sounds normal; no masses,  no organomegaly Extremities: extremities normal, atraumatic, no cyanosis or edema Pulses: 2+ and symmetric  EKG normal sinus rhythm at 92 with occasional PVCs  ASSESSMENT AND PLAN:   ESSENTIAL HYPERTENSION Well-controlled on current medications      Runell Gess MD [facility], [facility] 04/16/2013 5:09 PM\

## 2013-04-16 NOTE — Assessment & Plan Note (Signed)
Well-controlled on current medications 

## 2013-04-23 ENCOUNTER — Ambulatory Visit (HOSPITAL_COMMUNITY)
Admission: RE | Admit: 2013-04-23 | Discharge: 2013-04-23 | Disposition: A | Discharge status: Home / Self Care | Payer: Medicare Other | Source: Ambulatory Visit | Attending: Cardiovascular Disease | Admitting: Cardiovascular Disease

## 2013-04-23 DIAGNOSIS — R5383 Other fatigue: Secondary | ICD-10-CM | POA: Insufficient documentation

## 2013-04-23 DIAGNOSIS — Z01818 Encounter for other preprocedural examination: Secondary | ICD-10-CM

## 2013-04-23 DIAGNOSIS — R55 Syncope and collapse: Secondary | ICD-10-CM | POA: Insufficient documentation

## 2013-04-23 DIAGNOSIS — Z01810 Encounter for preprocedural cardiovascular examination: Secondary | ICD-10-CM

## 2013-04-23 DIAGNOSIS — I10 Essential (primary) hypertension: Secondary | ICD-10-CM

## 2013-04-23 DIAGNOSIS — R5381 Other malaise: Secondary | ICD-10-CM | POA: Insufficient documentation

## 2013-04-23 DIAGNOSIS — R42 Dizziness and giddiness: Secondary | ICD-10-CM | POA: Insufficient documentation

## 2013-04-23 MED ORDER — TECHNETIUM TC 99M SESTAMIBI GENERIC - CARDIOLITE
31.7000 | Freq: Once | INTRAVENOUS | Status: AC | PRN
  Administered 2013-04-23: 31.7 via INTRAVENOUS

## 2013-04-23 MED ORDER — REGADENOSON 0.4 MG/5ML IV SOLN
0.4000 mg | Freq: Once | INTRAVENOUS | Status: AC
  Administered 2013-04-23: 0.4 mg via INTRAVENOUS

## 2013-04-23 MED ORDER — TECHNETIUM TC 99M SESTAMIBI GENERIC - CARDIOLITE
10.9000 | Freq: Once | INTRAVENOUS | Status: AC | PRN
  Administered 2013-04-23: 10.9 via INTRAVENOUS

## 2013-04-23 NOTE — Procedures (Addendum)
[facility] [address] [place] [address] [phone]  Cardiology Nuclear Med Study  Walter Patrick is a 79 y.o. male     MRN : [id number]     DOB: September 07, 1932  Procedure Date: 04/23/2013  Nuclear Med Background Indication for Stress Test:  Surgical Clearance and Post Hospital History:  denies prior history Cardiac Risk Factors: Family History - CAD and Hypertension  Symptoms:  Dizziness, Fatigue, Near Syncope and Syncope   Nuclear Pre-Procedure Caffeine/Decaff Intake:  1:00am NPO After: 11am   IV Site: R Forearm  IV 0.9% NS with Angio Cath:  22g  Chest Size (in):  40"  IV Started by: Emmit Pomfret, RN  Height: 5\' 5"  (1.651 m)  Cup Size: n/a  BMI:  Body mass index is 25.13 kg/(m^2). Weight:  151 lb (68.493 kg)   Tech Comments:  n/a    Nuclear Med Study 1 or 2 day study: 1 day  Stress Test Type:  Lexiscan  Order Authorizing Provider:  Nanetta Batty, MD   Resting Radionuclide: Technetium 16m Sestamibi  Resting Radionuclide Dose: 10.9 mCi   Stress Radionuclide:  Technetium 39m Sestamibi  Stress Radionuclide Dose: 31.7 mCi           Stress Protocol Rest HR:82 Stress HR: 110  Rest BP: 124/75 Stress BP: 139/75  Exercise Time (min): n/a METS: n/a          Dose of Adenosine (mg):  n/a Dose of Lexiscan: 0.4 mg  Dose of Atropine (mg): n/a Dose of Dobutamine: n/a mcg/kg/min (at max HR)  Stress Test Technologist: Ernestene Mention, CCT Nuclear Technologist: Gonzella Lex, CNMT   Rest Procedure:  Myocardial perfusion imaging was performed at rest 45 minutes following the intravenous administration of Technetium 37m Sestamibi. Stress Procedure:  The patient received IV Lexiscan 0.4 mg over 15-seconds.  Technetium 17m Sestamibi injected at 30-seconds.  There were no significant changes with Lexiscan.  Quantitative spect images were obtained after a 45 minute delay.  Transient Ischemic Dilatation (Normal <1.22):   1.02 Lung/Heart Ratio (Normal <0.45):  0.23 QGS EDV:  103 ml QGS ESV:  46 ml LV Ejection Fraction: 56%  Signed by      Rest ECG: NSR - Normal EKG  Stress ECG: No significant change from baseline ECG  QPS Raw Data Images:  Normal; no motion artifact; normal heart/lung ratio. Stress Images:  There is decreased uptake in the inferior wall. Rest Images:  There is decreased uptake in the inferior wall. Subtraction (SDS):  There is a fixed inferior defect that is most consistent with diaphragmatic attenuation.  Impression Exercise Capacity:  Lexiscan with no exercise. BP Response:  Normal blood pressure response. Clinical Symptoms:  No significant symptoms noted. ECG Impression:  No significant ST segment change suggestive of ischemia. Comparison with Prior Nuclear Study: No significant change from previous study  Overall Impression:  Low risk stress nuclear study with diaphragmatic attenuation..  LV Wall Motion:  NL LV Function; NL Wall Motion   Runell Gess, MD  04/23/2013 5:59 PM

## 2013-04-27 ENCOUNTER — Telehealth: Payer: Self-pay | Admitting: Family Medicine

## 2013-04-27 NOTE — Telephone Encounter (Signed)
Pt needs stress test results. Pt had test done on 04-23-13

## 2013-04-27 NOTE — Telephone Encounter (Signed)
Pt informed

## 2013-04-27 NOTE — Telephone Encounter (Signed)
This will have to be from cardiology office -they set up this study.

## 2013-04-30 ENCOUNTER — Telehealth: Payer: Self-pay | Admitting: Family Medicine

## 2013-04-30 NOTE — Telephone Encounter (Signed)
Pt is still experiencing dizzy spells. Pt would like to know if you want to refer him at this point, or come back to see you.  Pls advise.

## 2013-04-30 NOTE — Telephone Encounter (Signed)
Caller informed on VM, re-evaluate with Dr Caryl Never

## 2013-04-30 NOTE — Telephone Encounter (Signed)
Re-evaluate

## 2013-05-04 ENCOUNTER — Encounter: Payer: Self-pay | Admitting: Family Medicine

## 2013-05-04 ENCOUNTER — Ambulatory Visit (INDEPENDENT_AMBULATORY_CARE_PROVIDER_SITE_OTHER): Payer: Medicare Other | Admitting: Family Medicine

## 2013-05-04 VITALS — BP 120/60 | Temp 98.4°F | Wt 150.0 lb

## 2013-05-04 DIAGNOSIS — I10 Essential (primary) hypertension: Secondary | ICD-10-CM

## 2013-05-04 DIAGNOSIS — R42 Dizziness and giddiness: Secondary | ICD-10-CM

## 2013-05-04 NOTE — Patient Instructions (Addendum)
Touch base if you have any recurrent episodes of dizziness or syncope

## 2013-05-04 NOTE — Progress Notes (Signed)
Subjective:    Patient ID: Walter Patrick, male    DOB: 1932/02/23, 79 y.o.   MRN: [id number]  HPI   Pt seen for evaluation of some ongoing light headedness and dizziness. Chronic problems of hypertension,osteoarthritis, bladder cancer. Episode of ?syncope recently with hospitalization and unrevealing extensive evaluation.  CT head, CXR, Echocardiogram, carotid dopplers, EEG all normal.  Pt just saw cardiology for preoperative clearance and nuclear stress test unremarkable.  Dizziness is described as "light headed"  No recurrent syncope.  Symptoms are not positional and usually occur while sitting.  He stopped recent hydrocodone about 4 days ago and dizziness has been less frequent since then. No focal weakness.  No speech change, confusion, visual changes.  Past Medical History  Diagnosis Date  . SKIN CANCER, HX OF 11/30/2010  . Arthritis   . Cancer     bladder  . SOB (shortness of breath) 07/18/2007    Echo showed Normal LV size. Mild DUST-No LV outfolw obstruction. Systolic function normal. EF>55%. No regional wall abn. Mild regurg in mitral and pulmonic valves, but no significant valvular disease noted.  . Palpitations 07/18/07  . Pre-syncope 07/18/2007    Normal NUC and ECHO.  . Right bundle branch block 07/18/2007    NUC indicated a normal pattern of perfusion in all regions post stress.Post stress LV is normal size. EF of 54%.  No evidence of inducible ischemia per EKG. Normal, low risk scan.  . ESSENTIAL HYPERTENSION 11/30/2010   Past Surgical History  Procedure Laterality Date  . Cystectomy  06/27/11  . US echocardiography  07/18/2007    showed Normal LV size. Mild DUST-No LV outfolw obstruction. Systolic function normal. EF>55%. No regional wall abn. Mild regurg in mitral and pulmonic valves, but no significant valvular disease noted.  . Cardiovascular stress test  07/18/2007    NUC indicated a normal pattern of perfusion in all regions post stress.Post stress LV is normal size.  EF of 54%.  No evidence of inducible ischemia per EKG. Normal, low risk scan.    reports that he has never smoked. He does not have any smokeless tobacco history on file. He reports that  drinks alcohol. His drug history is not on file. family history includes Heart disease in his father. Allergies  Allergen Reactions  . Hydrocodone     GI upset, caused him to have no taste in his mouth      Review of Systems  Constitutional: Negative for fever, chills, appetite change and unexpected weight change.  Respiratory: Negative for cough.   Cardiovascular: Negative for chest pain.  Gastrointestinal: Negative for nausea, vomiting, abdominal pain and blood in stool.  Endocrine: Negative for polydipsia and polyuria.  Musculoskeletal: Negative for back pain.  Neurological: Positive for dizziness. Negative for seizures, syncope and weakness.  Psychiatric/Behavioral: Negative for confusion.       Objective:   Physical Exam  Constitutional: He is oriented to person, place, and time. He appears well-developed and well-nourished.  HENT:  Right Ear: External ear normal.  Left Ear: External ear normal.  Mouth/Throat: Oropharynx is clear and moist.  Neck: Neck supple.  Cardiovascular: Normal rate and regular rhythm.   Pulmonary/Chest: Effort normal and breath sounds normal. No respiratory distress. He has no wheezes. He has no rales.  Abdominal: Soft.  Urostomy bag.    Musculoskeletal: He exhibits no edema.  Neurological: He is alert and oriented to person, place, and time. No cranial nerve deficit.  No focal strength deficits.  Gait normal.  Psychiatric: He has a normal mood and affect. His behavior is normal.          Assessment & Plan:  Dizziness.  He does not exhibit any orthostatic changes today.  I suspect this may be med related (hyrocodone) and he will continue to avoid that medication or any other opioids unless pain becomes severe.  Recent hgb 12.7.  Consider event monitor if  symptoms recur/persist.

## 2013-05-07 ENCOUNTER — Ambulatory Visit: Payer: Medicare Other | Admitting: Cardiovascular Disease

## 2013-05-11 ENCOUNTER — Telehealth: Payer: Self-pay | Admitting: Family Medicine

## 2013-05-11 DIAGNOSIS — R55 Syncope and collapse: Secondary | ICD-10-CM

## 2013-05-11 DIAGNOSIS — R42 Dizziness and giddiness: Secondary | ICD-10-CM

## 2013-05-11 NOTE — Telephone Encounter (Signed)
PT daughter states that the pt continues to experience dizzy spells. She stated that at his last appt, he was instructed to notify you if it continues. She states that you suggested a heart halter monitor. Please assist.

## 2013-05-12 NOTE — Telephone Encounter (Signed)
LMOM with contact name and number [for return call, if needed] RE: Cardiac Event Monitor order and further provider instructions/SLS

## 2013-05-12 NOTE — Telephone Encounter (Signed)
Set up event monitor-2week duration.  Dx dizziness and presyncopal symptoms. Make sure he is off hydrocodone.

## 2013-05-18 ENCOUNTER — Other Ambulatory Visit: Payer: Self-pay | Admitting: *Deleted

## 2013-05-18 ENCOUNTER — Telehealth: Payer: Self-pay | Admitting: Family Medicine

## 2013-05-18 DIAGNOSIS — R55 Syncope and collapse: Secondary | ICD-10-CM

## 2013-05-18 DIAGNOSIS — R42 Dizziness and giddiness: Secondary | ICD-10-CM

## 2013-05-18 NOTE — Telephone Encounter (Signed)
Spoke with patient and he will call cardiology. 

## 2013-05-18 NOTE — Telephone Encounter (Signed)
Pt daughter checking to see if holter monitor has got here yet.  She is not at home and would like a call on her cell phone.

## 2013-05-26 ENCOUNTER — Telehealth: Payer: Self-pay | Admitting: Cardiovascular Disease

## 2013-05-26 NOTE — Telephone Encounter (Signed)
Calling because a heart monitor was to mail to him and he has not receive it as of her and was calling to check on it .Marland Kitchen Please Call   Thanks

## 2013-05-26 NOTE — Telephone Encounter (Signed)
Call to [employer] and informed it was approved yesterday and monitor should be received tomorrow.  Stated pt should get a call today with shipping information.

## 2013-05-26 NOTE — Telephone Encounter (Signed)
Returned call and spoke w/ Jola Babinski.  Informed monitor has been ordered and is still in process of being sent out from [employer] ("In Scheduling").  Verbalized understanding and agreed to wait a few more days.

## 2013-05-28 ENCOUNTER — Telehealth: Payer: Self-pay | Admitting: *Deleted

## 2013-05-28 NOTE — Telephone Encounter (Signed)
Pt called w/ questions heart monitor.  Pt stated they only received 3 stickers for the monitor and it says they are supposed to be changed every other day.  Asked pt if he looked in the kit under the velcro flap and pt stated there were only 3 stickers.  Then pt stated "she found them."  Pt advised to call [employer] for more stickers since he stated he is supposed to wear the monitor for 30 days.  Pt verbalized understanding and agreed w/ plan.

## 2013-05-30 DIAGNOSIS — R42 Dizziness and giddiness: Secondary | ICD-10-CM

## 2013-06-05 ENCOUNTER — Telehealth: Payer: Self-pay | Admitting: *Deleted

## 2013-06-05 MED ORDER — METOPROLOL TARTRATE 25 MG PO TABS: 25.0000 mg | ORAL_TABLET | Freq: Two times a day (BID) | ORAL | Status: DC

## 2013-06-05 NOTE — Telephone Encounter (Signed)
Urgent EKG Report: New onset of Afib w/ autotrigger.  Awaiting fax report to notify provider.

## 2013-06-05 NOTE — Telephone Encounter (Signed)
Pt had 11 seconds of PAF. Make sure he is on an Aspirin 325 mg if he can take one. Also, add Metoprolol tartrate25 mg BID.

## 2013-06-05 NOTE — Telephone Encounter (Signed)
Call to pt and informed pt per instructions by MD/PA.  Pt stated he has not been taking ASA and advised to take ASA 325 mg daily.  Pt also informed metoprolol will be sent to pharmacy.  Pt verbalized understanding and agreed w/ plan.  Refill(s) sent to pharmacy.

## 2013-06-05 NOTE — Telephone Encounter (Signed)
Fax received.  Dr. Tresa Endo notified and advised PA be notified this afternoon.    Message forwarded to Hinda Glatter, PA-C for further instructions.  No paper chart per Medical Records staff.  Records in [place].  Report on Triage Cart.

## 2013-06-10 ENCOUNTER — Telehealth: Payer: Self-pay | Admitting: Cardiovascular Disease

## 2013-06-10 NOTE — Telephone Encounter (Signed)
Please call-question about what medicine he needs to take.

## 2013-06-10 NOTE — Telephone Encounter (Signed)
Returned call.  Pt asked if he should be taking the medicine that was called in that's 25 mg.  Stated he is taking the one that's 5 mg and the 325 mg ASA.  Pt informed metoprolol tartrate 25mg  BID was sent in last week to help control his increased HR and he should be taking that in addition to amlodipine and ASA.  Pt verbalized understanding and agreed w/ plan.

## 2013-07-01 ENCOUNTER — Telehealth: Payer: Self-pay | Admitting: Cardiovascular Disease

## 2013-07-01 ENCOUNTER — Encounter: Payer: Self-pay | Admitting: Family Medicine

## 2013-07-01 ENCOUNTER — Ambulatory Visit (INDEPENDENT_AMBULATORY_CARE_PROVIDER_SITE_OTHER): Payer: Medicare Other | Admitting: Family Medicine

## 2013-07-01 VITALS — BP 134/72 | HR 84 | Temp 98.9°F | Wt 151.0 lb

## 2013-07-01 DIAGNOSIS — R55 Syncope and collapse: Secondary | ICD-10-CM

## 2013-07-01 DIAGNOSIS — K5900 Constipation, unspecified: Secondary | ICD-10-CM

## 2013-07-01 DIAGNOSIS — I10 Essential (primary) hypertension: Secondary | ICD-10-CM

## 2013-07-01 NOTE — Telephone Encounter (Signed)
Is wanting someone to call him after we have receive the results from the heart monitor that he was wearing and to tell him when he needs to come in again .. . He has sent it back already .  Thanks

## 2013-07-01 NOTE — Progress Notes (Signed)
Subjective:    Patient ID: Walter Patrick, male    DOB: 22-Sep-1932, 79 y.o.   MRN: [id number]  HPI Patient seen for followup regarding several items  He had some intermittent constipation. He generally is trying to drink plenty of fluids. Stays quite active. He thinks he is getting enough fiber. He is taking occasional Fleet enema which does seem to help. He's taken some MiraLax intermittently. He has stool softeners not taking regularly. No abdominal pain. Previous colonoscopy 2006. Recent TSH normal.  Patient had episode of some recent dizziness and possible syncopal episode requiring hospitalization a few months ago. Extensive workup with chest x-ray, echocardiogram, carotid Dopplers, EEG, CT scan all unremarkable. Nuclear stress test unremarkable. Just turned in a event monitor a couple days ago. He has not heard final report.  He is having some vague dizziness without syncope last visit and after stopping hydrocodone no dizziness whatsoever. He takes amlodipine and metoprolol for hypertension but has never had any documented orthostasis. He denies any syncope, confusion, dizziness, or chest pain this time. He is very anxious to try to drive again. 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  Past Medical History  Diagnosis Date  . SKIN CANCER, HX OF 11/30/2010  . Arthritis   . Cancer     bladder  . SOB (shortness of breath) 07/18/2007    Echo showed Normal LV size. Mild DUST-No LV outfolw obstruction. Systolic function normal. EF>55%. No regional wall abn. Mild regurg in mitral and pulmonic valves, but no significant valvular disease noted.  . Palpitations 07/18/07  . Pre-syncope 07/18/2007    Normal NUC and ECHO.  . Right bundle branch block 07/18/2007    NUC indicated a normal pattern of perfusion in all regions post stress.Post stress LV is normal size. EF of 54%.  No evidence of inducible ischemia per EKG. Normal, low  risk scan.  . ESSENTIAL HYPERTENSION 11/30/2010   Past Surgical History  Procedure Laterality Date  . Cystectomy  06/27/11  . US echocardiography  07/18/2007    showed Normal LV size. Mild DUST-No LV outfolw obstruction. Systolic function normal. EF>55%. No regional wall abn. Mild regurg in mitral and pulmonic valves, but no significant valvular disease noted.  . Cardiovascular stress test  07/18/2007    NUC indicated a normal pattern of perfusion in all regions post stress.Post stress LV is normal size. EF of 54%.  No evidence of inducible ischemia per EKG. Normal, low risk scan.    reports that he has never smoked. He does not have any smokeless tobacco history on file. He reports that  drinks alcohol. His drug history is not on file. family history includes Heart disease in his father. Allergies  Allergen Reactions  . Hydrocodone     GI upset, caused him to have no taste in his mouth      Review of Systems  Constitutional: Negative for fever, activity change, appetite change, fatigue and unexpected weight change.  HENT: Negative for ear pain, congestion and trouble swallowing.   Eyes: Negative for pain and visual disturbance.  Respiratory: Negative for cough, shortness of breath and wheezing.   Cardiovascular: Negative for chest pain and palpitations.  Gastrointestinal: Negative for nausea, vomiting, abdominal pain, diarrhea, constipation, blood in stool, abdominal distention and rectal pain.  Genitourinary: Negative for dysuria, hematuria and testicular pain.  Musculoskeletal: Positive for arthralgias. Negative for joint swelling.  Skin: Negative for rash.  Neurological: Negative for  dizziness, syncope and headaches.  Hematological: Negative for adenopathy.  Psychiatric/Behavioral: Negative for confusion and dysphoric mood.       Objective:   Physical Exam  Constitutional: He is oriented to person, place, and time. He appears well-developed and well-nourished.  Cardiovascular:  Normal rate and regular rhythm.   Only occasional premature beat otherwise regular  Pulmonary/Chest: Effort normal and breath sounds normal. No respiratory distress. He has no wheezes. He has no rales.  Musculoskeletal: He exhibits no edema.  Neurological: He is alert and oriented to person, place, and time. No cranial nerve deficit.          Assessment & Plan:  #1 constipation. We discussed measures to avoid. Avoid regular use of laxatives. Start stool softener. Continue plenty of fluids and adequate fiber. #2 recent dizziness with possible syncopal episode a few months ago. None since then. Dizziness has resolved since stopping hydrocodone. No demonstrated orthostasis with blood pressure today sitting 138/70 and standing 140/70. Recent Holter monitor final report pending. If this is normal we would not see any clear reason to limit his driving at this time #3 hypertension well controlled with no orthostasis. Continue current medications

## 2013-07-01 NOTE — Patient Instructions (Addendum)

## 2013-07-01 NOTE — Telephone Encounter (Signed)
Returned call.  Pt informed he will be contacted after final report reviewed to let him know if he needs to be seen to review the results or not.  Pt verbalized understanding and agreed w/ plan.

## 2013-07-15 ENCOUNTER — Telehealth: Payer: Self-pay | Admitting: Cardiovascular Disease

## 2013-07-15 NOTE — Telephone Encounter (Signed)
Patient called back ---he no longer drives and will have to call back to schedule his appointment with Dr. Allyson Sabal.

## 2013-07-22 ENCOUNTER — Telehealth: Payer: Self-pay | Admitting: Cardiovascular Disease

## 2013-07-22 NOTE — Telephone Encounter (Signed)
Returned call.  Pt requesting monitor results.  Stated he talked to a nurse last week who told him she received the report and would call back with an appt.  Pt informed Gaylyn Rong, RN for Dr. Allyson Sabal will be notified as he may not have reviewed the results yet.  Pt verbalized understanding and agreed w/ plan.    Message forwarded to K. Petra Kuba, Charity fundraiser.  Pt needs results as soon as possible b/c if not given to [facility] by 9.15.14, he will lose his driver's license.

## 2013-07-22 NOTE — Telephone Encounter (Signed)
Please see telephone note in Epic.  Patient cancelled appt with Dr Allyson Sabal due to transportation issues.  I will forward this message to [facility] for scheduling.

## 2013-07-22 NOTE — Telephone Encounter (Signed)
Pt called back to say he had given me the wrong phone number.

## 2013-07-22 NOTE — Telephone Encounter (Signed)
Please call-waiting for test results from last week-said he waiting to get an appt with Dr Allyson Sabal.

## 2013-07-24 ENCOUNTER — Ambulatory Visit: Payer: Medicare Other | Admitting: Family Medicine

## 2013-07-27 ENCOUNTER — Ambulatory Visit: Payer: Medicare Other | Admitting: Family Medicine

## 2013-07-29 ENCOUNTER — Encounter: Payer: Self-pay | Admitting: Cardiology

## 2013-07-29 ENCOUNTER — Ambulatory Visit (INDEPENDENT_AMBULATORY_CARE_PROVIDER_SITE_OTHER): Payer: Medicare Other | Admitting: Cardiology

## 2013-07-29 VITALS — BP 120/60 | HR 62 | Ht 66.0 in | Wt 150.7 lb

## 2013-07-29 DIAGNOSIS — I472 Ventricular tachycardia: Secondary | ICD-10-CM

## 2013-07-29 DIAGNOSIS — M159 Polyosteoarthritis, unspecified: Secondary | ICD-10-CM

## 2013-07-29 DIAGNOSIS — R55 Syncope and collapse: Secondary | ICD-10-CM

## 2013-07-29 DIAGNOSIS — I471 Supraventricular tachycardia: Secondary | ICD-10-CM

## 2013-07-29 NOTE — Patient Instructions (Signed)
Your physician recommends that you schedule a follow-up appointment in: 3 months with Dr Allyson Sabal. You have been referred to [facility] EP for evaluation of your arrythmia

## 2013-07-29 NOTE — Assessment & Plan Note (Signed)
No further syncope but he has had brief (les than 5 seconds) of near syncope

## 2013-07-29 NOTE — Assessment & Plan Note (Signed)
Several runs of PSVT noted on Holter. These episodes persisted after the addition of a beta blocker.

## 2013-07-29 NOTE — Assessment & Plan Note (Signed)
PVCs and 5 beat NSVT noted on Holter
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
## 2013-07-29 NOTE — Assessment & Plan Note (Signed)
Awaiting cardiac clearance for TKR. He had low risk Myoview June 2014.

## 2013-07-31 ENCOUNTER — Telehealth: Payer: Self-pay | Admitting: Cardiovascular Disease

## 2013-07-31 NOTE — Telephone Encounter (Signed)
Mr. Boudoin was calling in regarding a letter that he needs from either [facility] or Dr. Allyson Sabal concerning DMV letter from May 9th. Can someone get back to him about this. He stated that you can leave a message if he doesn't answer.

## 2013-07-31 NOTE — Telephone Encounter (Signed)
I spoke with patient.  According to Luke's note from yesterday.  The EP will make the final decision about driving.  I advised patient to discuss this with EP at his appt.

## 2013-07-31 NOTE — Telephone Encounter (Signed)
Returned call.  Pt informed message received and chart reviewed.  Form completed in June by Dr. Caryl Never.  Pt stated he was still having problems w/ dizziness then and Dr. Caryl Never sent him back to Dr. Allyson Sabal.  Stated he needs a new form completed and Dr. Allyson Sabal should be getting a new form.  Pt informed Samara Deist will be notified as she would have received the form.  Pt stated he needs his driver's license and needs someone to step up and handle this.  Informed again message will be sent.  Pt verbalized understanding and agreed w/ plan.  Message forwarded to K. Petra Kuba, Charity fundraiser.

## 2013-08-03 ENCOUNTER — Telehealth: Payer: Self-pay | Admitting: Cardiology

## 2013-08-19 ENCOUNTER — Other Ambulatory Visit: Payer: Self-pay | Admitting: Internal Medicine

## 2013-08-19 ENCOUNTER — Encounter: Payer: Self-pay | Admitting: Internal Medicine

## 2013-08-19 ENCOUNTER — Encounter: Payer: Self-pay | Admitting: *Deleted

## 2013-08-19 ENCOUNTER — Ambulatory Visit (INDEPENDENT_AMBULATORY_CARE_PROVIDER_SITE_OTHER): Payer: Medicare Other | Admitting: Internal Medicine

## 2013-08-19 VITALS — BP 142/88 | HR 75 | Ht 62.0 in | Wt 148.0 lb

## 2013-08-19 DIAGNOSIS — I471 Supraventricular tachycardia: Secondary | ICD-10-CM

## 2013-08-19 DIAGNOSIS — R55 Syncope and collapse: Secondary | ICD-10-CM

## 2013-08-19 DIAGNOSIS — I472 Ventricular tachycardia: Secondary | ICD-10-CM
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
## 2013-08-19 NOTE — Patient Instructions (Addendum)
Your physician has recommended that you have a loop recorder placed.   Your physician recommends that you continue on your current medications as directed. Please refer to the Current Medication list given to you today.    Please call Dory Horn RN with any questions/concerns: [phone]

## 2013-08-19 NOTE — Assessment & Plan Note (Signed)
Occurring in the context of a normal heart by Myoview and echo, (essentially), they don't have significant prognostic import  And as such in the absence of there being a cause of his presyncopal episodes I would not treat them

## 2013-08-19 NOTE — Assessment & Plan Note (Signed)
As above The issue here is similar to that of the ventricular tachycardia. Event recorder is most consistent with a reentrant rhythm at initiated by a PAC and thus probably an accessory pathway. I don't see PR prolongation to suggest AV nodal reentry there is a retrograde P wave is inscribed  in the proximal ST segment
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
## 2013-08-31 ENCOUNTER — Encounter (HOSPITAL_COMMUNITY): Payer: Self-pay | Admitting: Pharmacy Technician

## 2013-08-31 ENCOUNTER — Other Ambulatory Visit: Payer: Medicare Other

## 2013-08-31 DIAGNOSIS — R55 Syncope and collapse: Secondary | ICD-10-CM

## 2013-08-31 LAB — CBC WITH DIFFERENTIAL/PLATELET
Basophils Absolute: 0 10*3/uL (ref 0.0–0.1)
Basophils Relative: 0 % (ref 0–1)
Eosinophils Relative: 2 % (ref 0–5)
HCT: 41.6 % (ref 39.0–52.0)
MCH: 32.9 pg (ref 26.0–34.0)
MCHC: 34.4 g/dL (ref 30.0–36.0)
MCV: 95.6 fL (ref 78.0–100.0)
Monocytes Absolute: 0.6 10*3/uL (ref 0.1–1.0)
Monocytes Relative: 9 % (ref 3–12)
RDW: 13.1 % (ref 11.5–15.5)

## 2013-08-31 LAB — PROTIME-INR
INR: 1.03 (ref <1.50)
Prothrombin Time: 13.3 seconds (ref 11.6–15.2)

## 2013-08-31 LAB — BASIC METABOLIC PANEL
CO2: 29 mEq/L (ref 19–32)
Calcium: 9.6 mg/dL (ref 8.4–10.5)
Chloride: 99 mEq/L (ref 96–112)
Glucose, Bld: 92 mg/dL (ref 70–99)
Potassium: 4.1 mEq/L (ref 3.5–5.3)
Sodium: 135 mEq/L (ref 135–145)

## 2013-09-03 ENCOUNTER — Ambulatory Visit (HOSPITAL_COMMUNITY)
Admission: RE | Admit: 2013-09-03 | Discharge: 2013-09-03 | Disposition: A | Discharge status: Home / Self Care | Payer: Medicare Other | Source: Ambulatory Visit | Attending: Internal Medicine | Admitting: Internal Medicine

## 2013-09-03 ENCOUNTER — Encounter (HOSPITAL_COMMUNITY)
Admission: RE | Disposition: A | Discharge status: Home / Self Care | Payer: Self-pay | Source: Ambulatory Visit | Attending: Internal Medicine

## 2013-09-03 DIAGNOSIS — I472 Ventricular tachycardia, unspecified: Secondary | ICD-10-CM | POA: Insufficient documentation

## 2013-09-03 DIAGNOSIS — R55 Syncope and collapse: Secondary | ICD-10-CM | POA: Insufficient documentation

## 2013-09-03 DIAGNOSIS — I4729 Other ventricular tachycardia: Secondary | ICD-10-CM | POA: Insufficient documentation

## 2013-09-03 DIAGNOSIS — M1710 Unilateral primary osteoarthritis, unspecified knee: Secondary | ICD-10-CM | POA: Insufficient documentation

## 2013-09-03 DIAGNOSIS — I4510 Unspecified right bundle-branch block: Secondary | ICD-10-CM | POA: Insufficient documentation

## 2013-09-03 DIAGNOSIS — Z8551 Personal history of malignant neoplasm of bladder: Secondary | ICD-10-CM | POA: Insufficient documentation

## 2013-09-03 DIAGNOSIS — Z79899 Other long term (current) drug therapy: Secondary | ICD-10-CM | POA: Insufficient documentation

## 2013-09-03 DIAGNOSIS — I517 Cardiomegaly: Secondary | ICD-10-CM | POA: Insufficient documentation

## 2013-09-03 DIAGNOSIS — I10 Essential (primary) hypertension: Secondary | ICD-10-CM | POA: Insufficient documentation

## 2013-09-03 DIAGNOSIS — Z85828 Personal history of other malignant neoplasm of skin: Secondary | ICD-10-CM | POA: Insufficient documentation

## 2013-09-03 DIAGNOSIS — Z7982 Long term (current) use of aspirin: Secondary | ICD-10-CM | POA: Insufficient documentation

## 2013-09-03 HISTORY — PX: LOOP RECORDER IMPLANT: SHX5477

## 2013-09-03 LAB — SURGICAL PCR SCREEN
MRSA, PCR: NEGATIVE
Staphylococcus aureus: NEGATIVE

## 2013-09-03 SURGERY — LOOP RECORDER IMPLANT
Anesthesia: LOCAL

## 2013-09-03 MED ORDER — LIDOCAINE-EPINEPHRINE 1 %-1:100000 IJ SOLN
INTRAMUSCULAR | Status: AC
  Filled 2013-09-03: qty 1

## 2013-09-03 MED ORDER — MUPIROCIN 2 % EX OINT
TOPICAL_OINTMENT | Freq: Two times a day (BID) | CUTANEOUS | Status: DC
  Filled 2013-09-03: qty 22

## 2013-09-03 MED ORDER — MUPIROCIN 2 % EX OINT
TOPICAL_OINTMENT | CUTANEOUS | Status: AC
  Filled 2013-09-03: qty 22

## 2013-09-03 NOTE — CV Procedure (Signed)
Pre op Dx syncope Post op Dx    Procedure  Loop Recorder implantation  After routine prep and drape of the left parasternal area, a small incision was created. A [employer] LINQ Reveal Loop Recorder  Serial Number  [id number] was inserted.    Steri-Strips were applied.  The patient tolerated the procedure without apparent complication.
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
## 2013-09-03 NOTE — Interval H&P Note (Signed)
History and Physical Interval Note:  09/03/2013 7:18 AM  Walter Patrick  has presented today for surgery, with the diagnosis of syncope  The various methods of treatment have been discussed with the patient and family. After consideration of risks, benefits and other options for treatment, the patient has consented to  Procedure(s): LOOP RECORDER IMPLANT (N/A) as a surgical intervention .  The patient's history has been reviewed, patient examined, no change in status, stable for surgery.  I have reviewed the patient's chart and labs.  Questions were answered to the patient's satisfaction.     Sherryl Manges

## 2013-09-14 ENCOUNTER — Ambulatory Visit: Payer: Medicare Other

## 2013-09-15 ENCOUNTER — Telehealth: Payer: Self-pay | Admitting: Internal Medicine

## 2013-09-15 NOTE — Telephone Encounter (Signed)
Explained that orthopedic office needs to fax over clearance request and we will address it. Ms. Walter Patrick agreeable to plan.

## 2013-09-15 NOTE — Telephone Encounter (Signed)
New Problem:  Pt's wife states her father needs surgical clearance for knee replacements. please advise

## 2013-09-15 NOTE — Telephone Encounter (Signed)
Follow Up:  Mr Vallarie Mare called back w/ the fax to the orthopedic office... For surgical clearance.... Fax # [phone] Attn: Lorina Rabon

## 2013-09-18 ENCOUNTER — Ambulatory Visit (INDEPENDENT_AMBULATORY_CARE_PROVIDER_SITE_OTHER): Payer: Medicare Other | Admitting: Cardiology

## 2013-09-18 ENCOUNTER — Encounter: Payer: Self-pay | Admitting: Cardiology

## 2013-09-18 ENCOUNTER — Telehealth: Payer: Self-pay | Admitting: *Deleted

## 2013-09-18 ENCOUNTER — Encounter: Payer: Self-pay | Admitting: Internal Medicine

## 2013-09-18 VITALS — BP 110/56 | HR 79 | Ht 67.0 in | Wt 148.0 lb

## 2013-09-18 DIAGNOSIS — Z4509 Encounter for adjustment and management of other cardiac device: Secondary | ICD-10-CM

## 2013-09-18 DIAGNOSIS — R42 Dizziness and giddiness: Secondary | ICD-10-CM

## 2013-09-18 DIAGNOSIS — R55 Syncope and collapse: Secondary | ICD-10-CM

## 2013-09-18 DIAGNOSIS — Z95818 Presence of other cardiac implants and grafts: Secondary | ICD-10-CM

## 2013-09-18 DIAGNOSIS — I471 Supraventricular tachycardia: Secondary | ICD-10-CM

## 2013-09-18 LAB — PACEMAKER DEVICE OBSERVATION

## 2013-09-18 NOTE — Patient Instructions (Signed)
Your physician recommends that you schedule a follow-up appointment in: DR. Graciela Husbands IN 3 MONTHS  Your physician recommends that you continue on your current medications as directed. Please refer to the Current Medication list given to you today.

## 2013-09-18 NOTE — Telephone Encounter (Signed)
Faxed Ortho clearance paperwork back stating they needed to refer cardiac clearance to Dr. Allyson Sabal, patient's primary cardiologist - this is per Dr. Graciela Husbands.

## 2013-09-21 ENCOUNTER — Telehealth: Payer: Self-pay | Admitting: Cardiovascular Disease

## 2013-09-21 NOTE — Progress Notes (Signed)
Wound check and ILR check post implant 09/03/2013.  Walter Patrick denies any complaints. He denies any episodes of dizziness, near syncope or syncope since implant.  ILR interrogation shows 0 symptom episodes, 2 tachy episodes which appear as SVT (not new for him and asymptomatic currently) and 2 brady episodes which appear to be PVCs / ventricular bigeminy, also asymptomatic. Please see [facility] report.  Wound / ILR insertion site intact and completely well healed. No erythema, edema, warmth or drainage. Steri-strips removed without difficulty.

## 2013-09-21 NOTE — Telephone Encounter (Signed)
Please call-need clarence from Dr Allyson Sabal to have knee surgery. Wants to know what he needs to do this?Saw Dr Graciela Husbands last week-he said he needed clarence from Dr Allyson Sabal.

## 2013-09-21 NOTE — Telephone Encounter (Signed)
Returned call and pt verified x 2 w/ Jola Babinski, pt's daughter.  Informed message received and chart reviewed.  Informed per records, clearance form was faxed to Dr. Allyson Sabal on 10.31.14 and once reviewed and signed it will be faxed back.  Daughter would like a call when complete.  Informed Samara Deist, RN for Dr. Allyson Sabal will be notified of her request for call when complete.  Verbalized understanding.

## 2013-09-23 ENCOUNTER — Encounter: Payer: Self-pay | Admitting: Internal Medicine

## 2013-09-23 ENCOUNTER — Telehealth: Payer: Self-pay | Admitting: Internal Medicine

## 2013-09-23 NOTE — Telephone Encounter (Signed)
I explained to her that I faxed information stating that it needed to be cleared by Dr. Allyson Sabal. I called Dr Hazle Coca office and spoke with his nurse Samara Deist) whom stated she would review with Dr .Allyson Sabal this afternoon if possible. I also called ortho office and asked them to fax new clearance to Dr. Allyson Sabal. Patient's daughter verbalized understanding.

## 2013-09-23 NOTE — Telephone Encounter (Signed)
New Problem  Pt called and states that he is having a hard time retrieving his clearance for a double knee replacement. He states that he cannot schedule his surgery without this clearance// Please assist.

## 2013-09-24 NOTE — Telephone Encounter (Signed)
Jola Babinski notified that Dr Allyson Sabal reviewed the form and cleared him for surgery.  Form was faxed to Sports Medicine

## 2013-09-30 ENCOUNTER — Encounter: Payer: Self-pay | Admitting: Internal Medicine

## 2013-10-05 ENCOUNTER — Ambulatory Visit (INDEPENDENT_AMBULATORY_CARE_PROVIDER_SITE_OTHER): Payer: Medicare Other | Admitting: *Deleted

## 2013-10-05 DIAGNOSIS — R55 Syncope and collapse: Secondary | ICD-10-CM

## 2013-10-08 ENCOUNTER — Telehealth: Payer: Self-pay | Admitting: *Deleted

## 2013-10-08 NOTE — Telephone Encounter (Signed)
Spoke w/pt in regards to sending transmissions daily with [facility]. Instructed pt did not have to send everyday. Pt aware and will stop sending.

## 2013-10-09 LAB — MDC_IDC_ENUM_SESS_TYPE_REMOTE

## 2013-10-12 ENCOUNTER — Encounter: Payer: Self-pay | Admitting: Cardiovascular Disease

## 2013-10-13 ENCOUNTER — Other Ambulatory Visit: Payer: Self-pay | Admitting: Orthopedic Surgery
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
## 2013-10-19 ENCOUNTER — Encounter (HOSPITAL_COMMUNITY): Payer: Self-pay | Admitting: Pharmacy Technician

## 2013-10-19 ENCOUNTER — Encounter: Payer: Self-pay | Admitting: Internal Medicine

## 2013-10-20 ENCOUNTER — Ambulatory Visit (HOSPITAL_COMMUNITY)
Admission: RE | Admit: 2013-10-20 | Discharge: 2013-10-20 | Disposition: A | Discharge status: Home / Self Care | Payer: Medicare Other | Source: Ambulatory Visit | Attending: Orthopedic Surgery | Admitting: Orthopedic Surgery

## 2013-10-20 ENCOUNTER — Encounter (HOSPITAL_COMMUNITY): Payer: Self-pay

## 2013-10-20 ENCOUNTER — Encounter (HOSPITAL_COMMUNITY)
Admission: RE | Admit: 2013-10-20 | Discharge: 2013-10-20 | Disposition: A | Discharge status: Home / Self Care | Payer: Medicare Other | Source: Ambulatory Visit | Attending: Orthopedic Surgery | Admitting: Orthopedic Surgery

## 2013-10-20 DIAGNOSIS — M129 Arthropathy, unspecified: Secondary | ICD-10-CM | POA: Insufficient documentation

## 2013-10-20 DIAGNOSIS — R911 Solitary pulmonary nodule: Secondary | ICD-10-CM | POA: Insufficient documentation

## 2013-10-20 DIAGNOSIS — I10 Essential (primary) hypertension: Secondary | ICD-10-CM | POA: Insufficient documentation

## 2013-10-20 DIAGNOSIS — R0602 Shortness of breath: Secondary | ICD-10-CM | POA: Insufficient documentation

## 2013-10-20 LAB — CBC WITH DIFFERENTIAL/PLATELET
Eosinophils Relative: 1 % (ref 0–5)
HCT: 41.3 % (ref 39.0–52.0)
Hemoglobin: 14.4 g/dL (ref 13.0–17.0)
Lymphocytes Relative: 15 % (ref 12–46)
Lymphs Abs: 1.2 10*3/uL (ref 0.7–4.0)
MCV: 93.7 fL (ref 78.0–100.0)
Monocytes Absolute: 0.9 10*3/uL (ref 0.1–1.0)
Neutro Abs: 6 10*3/uL (ref 1.7–7.7)
Neutrophils Relative %: 73 % (ref 43–77)
Platelets: 241 10*3/uL (ref 150–400)
RBC: 4.41 MIL/uL (ref 4.22–5.81)
RDW: 12.9 % (ref 11.5–15.5)
WBC: 8.2 10*3/uL (ref 4.0–10.5)

## 2013-10-20 LAB — URINALYSIS, ROUTINE W REFLEX MICROSCOPIC
Glucose, UA: NEGATIVE mg/dL
Ketones, ur: 15 mg/dL — AB
Nitrite: POSITIVE — AB
Protein, ur: NEGATIVE mg/dL
Urobilinogen, UA: 1 mg/dL (ref 0.0–1.0)

## 2013-10-20 LAB — COMPREHENSIVE METABOLIC PANEL
ALT: 12 U/L (ref 0–53)
Albumin: 3.5 g/dL (ref 3.5–5.2)
CO2: 28 mEq/L (ref 19–32)
Calcium: 9.8 mg/dL (ref 8.4–10.5)
Chloride: 102 mEq/L (ref 96–112)
GFR calc Af Amer: >90 mL/min (ref >90)
GFR calc non Af Amer: 88 mL/min — ABNORMAL LOW (ref >90)
Glucose, Bld: 92 mg/dL (ref 70–99)
Sodium: 140 mEq/L (ref 135–145)

## 2013-10-20 LAB — APTT: aPTT: 34 seconds (ref 24–37)

## 2013-10-20 LAB — TYPE AND SCREEN
ABO/RH(D): B POS
Antibody Screen: NEGATIVE

## 2013-10-20 LAB — URINE MICROSCOPIC-ADD ON

## 2013-10-20 LAB — ABO/RH: ABO/RH(D): B POS

## 2013-10-20 LAB — PROTIME-INR: Prothrombin Time: 13 seconds (ref 11.6–15.2)

## 2013-10-20 NOTE — Progress Notes (Signed)
Talked with nurse at Dr Tobin Chad office for cardiac clearance ,stated they would fax it once they receive it from Dr Allyson Sabal

## 2013-10-20 NOTE — Pre-Procedure Instructions (Signed)
Walter Patrick  10/20/2013   Your procedure is scheduled on:  Monday December 15 th at 0730 AM  Report to [facility] Short Stay Main Entrance "A" at 0530 AM.  Call this number if you have problems the morning of surgery: [phone]   Remember:   Do not eat food or drink liquids after midnight.   Take these medicines the morning of surgery with A SIP OF WATER: Amlodipine, And Metoprolol  Stop Aspirin and Nsaid's(Ibuprofen, Advil, Aleve) 10/28/13  Do not wear jewelry.  Do not wear lotions, powders.. You may wear deodorant.  Men may shave face and neck.  Do not bring valuables to the hospital.  [facility] is not responsible for any belongings or valuables.               Contacts, dentures or bridgework may not be worn into surgery.  Leave suitcase in the car. After surgery it may be brought to your room.  For patients admitted to the hospital, discharge time is determined by your  treatment team.               Patients discharged the day of surgery will not be allowed to drive home.    Special Instructions: Incentive Spirometry - Practice and bring it with you on the day of surgery. Shower using CHG 2 nights before surgery and the night before surgery.  If you shower the day of surgery use CHG.  Use special wash - you have one bottle of CHG for all showers.  You should use approximately 1/3 of the bottle for each shower.   Please read over the following fact sheets that you were given: Pain Booklet, Coughing and Deep Breathing, Blood Transfusion Information, MRSA Information and Surgical Site Infection Prevention

## 2013-10-21 NOTE — Progress Notes (Signed)
Anesthesia Chart Review:  Patient is a 79 year old male scheduled for left TKA on 11/02/13 by Dr. Sherlean Foot.  History includes non-smoker, HTN, arthritis, skin cancer, bladder cancer s/p cystectomy with urostomy, loop recorder implanted 08/2013 for further evaluation of a syncopal/near syncopal episode 03/2013 with SVT and NSVT noted on Holter ~ 06/2013. His EEG was negative for seizures and head CT negative for CVA at that time. PCP is Dr. Evelena Peat.  Primary cardiologist is Dr. Nanetta Batty.  He has also seen by EP cardiologist Dr. Graciela Husbands. Notes in Epic indicate that Dr. Allyson Sabal cleared patient for this procedure (see telephone encounter from 09/24/13 by Petra Kuba, RN). He had a loop recorder (ILR) implanted on 09/03/13 for further evaluation of syncope.  Interrogation on 09/21/13 showed 2 tachy/SVT episodes and 2 brady episodes which appear to be PVCs/ventricular bigeminy which were all asymptomatic.   Holter monitor ~ 06/2013 demonstrated nonsustained ventricular tachycardia as well as recurrent episodes of SVT. There were no associated symptoms  Nuclear stress test on 04/23/13 showed: There is a fixed inferior defect that is most consistent with diaphragmatic attenuation. Low risk stress nuclear study with diaphragmatic attenuation. LV Wall Motion: NL LV Function; NL Wall Motion.  Echo on 03/28/13 showed: - Left ventricle: Wall thickness was increased in a pattern of mild LVH. Systolic function was normal. The estimated ejection fraction was in the range of 50% to 55%. Regional wall motion abnormalities cannot be excluded. Doppler parameters are consistent with abnormal left ventricular relaxation (grade 1 diastolic dysfunction). - Aortic valve: Mild to moderate regurgitation. - Aorta: Ascending aorta is mildly dilated. - Mitral valve: Trivial regurgitation. - Left atrium: The atrium was mildly dilated. - Atrial septum: No defect or patent foramen ovale was identified. - Tricuspid valve: Trivial  regurgitation.  EKG on 10/20/13 showed NSR.  Carotid duplex on 03/29/13 showed no significant extracranial carotid artery stenosis, antegrade vertebral artery flow.  Normal awake EEG 03/27/13.  CXR on 10/20/13 showed: No acute chest abnormality. Stable 6 mm nodule in the right lung probably represents a granuloma. Probable bilateral nipple shadows as described. Chronic bone changes.   Preoperative labs noted.  Urine culture shows > 100,000 E. Coli.  Dr. Tobin Chad office confirmed that they had received a copy of the report and will have him review for further recommendations.  He has had a recent extensive cardiac work-up and now has a loop recorder.  His cardiologist has cleared him for surgery.  If no acute changes then I would anticipate that he could proceed as planned.  Velna Ochs [facility] Short Stay Center/Anesthesiology Phone [phone] 10/21/2013 2:33 PM

## 2013-10-22 LAB — URINE CULTURE

## 2013-10-23 ENCOUNTER — Other Ambulatory Visit (HOSPITAL_COMMUNITY): Payer: Medicare Other

## 2013-10-23 ENCOUNTER — Telehealth: Payer: Self-pay | Admitting: Cardiovascular Disease

## 2013-10-23 NOTE — Telephone Encounter (Signed)
Hasn't seen Dr. Allyson Sabal since 6/14.  Recommended he contact Dr. Graciela Husbands for this letter since he is monitoring info on Linq Recorder.  Voiced understanding.

## 2013-10-23 NOTE — Telephone Encounter (Signed)
Needs for Dr Allyson Sabal to write letter to get extension on drivers license. Please call.

## 2013-10-28 ENCOUNTER — Encounter: Payer: Self-pay | Admitting: Family Medicine

## 2013-10-28 ENCOUNTER — Ambulatory Visit (INDEPENDENT_AMBULATORY_CARE_PROVIDER_SITE_OTHER): Payer: Medicare Other | Admitting: Family Medicine

## 2013-10-28 VITALS — BP 120/70 | HR 73 | Temp 97.4°F | Wt 140.0 lb

## 2013-10-28 DIAGNOSIS — R82998 Other abnormal findings in urine: Secondary | ICD-10-CM

## 2013-10-28 DIAGNOSIS — R8271 Bacteriuria: Secondary | ICD-10-CM

## 2013-10-28 DIAGNOSIS — Z23 Encounter for immunization: Secondary | ICD-10-CM

## 2013-10-28 NOTE — Progress Notes (Signed)
   Subjective:    Patient ID: Walter Patrick, male    DOB: May 16, 1932, 79 y.o.   MRN: [id number]  HPI Patient seen to evaluate possible recent UTI. Getting ready to have total knee replacement. He states that preoperative labs revealed positive urine culture. However, he has history of bladder cancer and has an external conduit with bag so he is very likely colonized. He was not describing any symptoms such as fever or chills. His culture did grow Escherichia coli and he was treated with ciprofloxacin 500 mg twice a day for 7 days. Totally asymptomatic at this time. He emptys his bag at least 5 times daily.  Patient will have total knee replacement on 11/02/2013. He has not had flu vaccine nor pneumonia vaccine and we did convince him to go ahead and get both prior to surgery.  Past Medical History  Diagnosis Date  . SKIN CANCER, HX OF 11/30/2010  . Arthritis - severe knees   . SOB (shortness of breath) 07/18/2007    Echo showed Normal LV size. Mild DUST-No LV outfolw obstruction. Systolic function normal. EF>55%. No regional wall abn. Mild regurg in mitral and pulmonic valves, but no significant valvular disease noted.  . Palpitations 07/18/07  . Pre-syncope 07/18/2007    Normal NUC and ECHO.  . ESSENTIAL HYPERTENSION 11/30/2010  . Cancer bladder s/p cystectomy     cystostomy has cysto bag   Past Surgical History  Procedure Laterality Date  . Cystectomy  06/27/11  . US echocardiography  07/18/2007    showed Normal LV size. Mild DUST-No LV outfolw obstruction. Systolic function normal. EF>55%. No regional wall abn. Mild regurg in mitral and pulmonic valves, but no significant valvular disease noted.  . Cardiovascular stress test  07/18/2007    NUC indicated a normal pattern of perfusion in all regions post stress.Post stress LV is normal size. EF of 54%.  No evidence of inducible ischemia per EKG. Normal, low risk scan.  . Eye surgery Bilateral     cataracts  . Fracture surgery Left     x2  basket ball and football in high school    reports that he has never smoked. He has never used smokeless tobacco. He reports that he drinks alcohol. He reports that he does not use illicit drugs. family history includes Heart disease in his father. Allergies  Allergen Reactions  . Hydrocodone     GI upset, caused him to have no taste in his mouth      Review of Systems  Constitutional: Negative for fever and chills.  Respiratory: Negative for shortness of breath.   Cardiovascular: Negative for chest pain.  Gastrointestinal: Negative for abdominal pain.       Objective:   Physical Exam  Constitutional: He appears well-developed and well-nourished.  Cardiovascular: Normal rate.   Pulmonary/Chest: Effort normal and breath sounds normal. No respiratory distress. He has no wheezes. He has no rales.  Musculoskeletal: He exhibits no edema.          Assessment & Plan:  #1 recent positive urine culture. We explained he is very likely colonized and is basically asymptomatic otherwise with no fever. We've not recommended repeat urine culture in this patient with likely colonization. He knows to followup promptly for any fever or new symptoms. #2 health maintenance. Flu vaccine and pneumonia vaccine given

## 2013-10-28 NOTE — Progress Notes (Signed)
Pre visit review using our clinic review tool, if applicable. No additional management support is needed unless otherwise documented below in the visit note. 

## 2013-11-01 MED ORDER — TRANEXAMIC ACID 100 MG/ML IV SOLN
1000.0000 mg | INTRAVENOUS | Status: AC
  Administered 2013-11-02: 1000 mg via INTRAVENOUS
  Filled 2013-11-01: qty 10

## 2013-11-01 MED ORDER — CEFAZOLIN SODIUM 10 G IJ SOLR
3.0000 g | INTRAMUSCULAR | Status: AC
  Administered 2013-11-02: 3 g via INTRAVENOUS
  Filled 2013-11-01: qty 3000

## 2013-11-02 ENCOUNTER — Encounter (HOSPITAL_COMMUNITY): Payer: Medicare Other | Admitting: Vascular Surgery

## 2013-11-02 ENCOUNTER — Inpatient Hospital Stay (HOSPITAL_COMMUNITY)
Admission: RE | Admit: 2013-11-02 | Discharge: 2013-11-03 | DRG: 469 | Disposition: A | Discharge status: Home Health | Payer: Medicare Other | Source: Ambulatory Visit | Attending: Orthopedic Surgery | Admitting: Orthopedic Surgery

## 2013-11-02 ENCOUNTER — Inpatient Hospital Stay (HOSPITAL_COMMUNITY): Payer: Medicare Other | Admitting: Anesthesiology

## 2013-11-02 ENCOUNTER — Encounter (HOSPITAL_COMMUNITY)
Admission: RE | Disposition: A | Discharge status: Home Health | Payer: Self-pay | Source: Ambulatory Visit | Attending: Orthopedic Surgery

## 2013-11-02 ENCOUNTER — Encounter (HOSPITAL_COMMUNITY): Payer: Self-pay | Admitting: *Deleted

## 2013-11-02 DIAGNOSIS — Z8551 Personal history of malignant neoplasm of bladder: Secondary | ICD-10-CM

## 2013-11-02 DIAGNOSIS — Z87440 Personal history of urinary (tract) infections: Secondary | ICD-10-CM

## 2013-11-02 DIAGNOSIS — Z96659 Presence of unspecified artificial knee joint: Secondary | ICD-10-CM

## 2013-11-02 DIAGNOSIS — Z8249 Family history of ischemic heart disease and other diseases of the circulatory system: Secondary | ICD-10-CM

## 2013-11-02 DIAGNOSIS — I10 Essential (primary) hypertension: Secondary | ICD-10-CM | POA: Diagnosis present

## 2013-11-02 DIAGNOSIS — E43 Unspecified severe protein-calorie malnutrition: Secondary | ICD-10-CM | POA: Diagnosis present

## 2013-11-02 DIAGNOSIS — Z85828 Personal history of other malignant neoplasm of skin: Secondary | ICD-10-CM

## 2013-11-02 DIAGNOSIS — Z96652 Presence of left artificial knee joint: Secondary | ICD-10-CM

## 2013-11-02 DIAGNOSIS — M1710 Unilateral primary osteoarthritis, unspecified knee: Principal | ICD-10-CM | POA: Diagnosis present

## 2013-11-02 DIAGNOSIS — D62 Acute posthemorrhagic anemia: Secondary | ICD-10-CM | POA: Diagnosis not present

## 2013-11-02 HISTORY — PX: JOINT REPLACEMENT: SHX530

## 2013-11-02 HISTORY — PX: TOTAL KNEE ARTHROPLASTY: SHX125

## 2013-11-02 LAB — CBC
HCT: 36.3 % — ABNORMAL LOW (ref 39.0–52.0)
Hemoglobin: 12.5 g/dL — ABNORMAL LOW (ref 13.0–17.0)
MCH: 32.8 pg (ref 26.0–34.0)
MCHC: 34.4 g/dL (ref 30.0–36.0)
MCV: 95.3 fL (ref 78.0–100.0)
RBC: 3.81 MIL/uL — ABNORMAL LOW (ref 4.22–5.81)
RDW: 12.9 % (ref 11.5–15.5)
WBC: 6.2 10*3/uL (ref 4.0–10.5)

## 2013-11-02 LAB — CREATININE, SERUM: GFR calc Af Amer: >90 mL/min (ref >90)

## 2013-11-02 SURGERY — ARTHROPLASTY, KNEE, TOTAL
Anesthesia: General | Site: Knee | Laterality: Left

## 2013-11-02 MED ORDER — BUPIVACAINE LIPOSOME 1.3 % IJ SUSP
INTRAMUSCULAR | Status: DC | PRN
  Administered 2013-11-02: 20 mL

## 2013-11-02 MED ORDER — METOPROLOL TARTRATE 25 MG PO TABS
25.0000 mg | ORAL_TABLET | Freq: Two times a day (BID) | ORAL | Status: DC
  Administered 2013-11-02 – 2013-11-03 (×2): 25 mg via ORAL
  Filled 2013-11-02 (×3): qty 1

## 2013-11-02 MED ORDER — METOCLOPRAMIDE HCL 5 MG/ML IJ SOLN: 5.0000 mg | Freq: Three times a day (TID) | INTRAMUSCULAR | Status: DC | PRN

## 2013-11-02 MED ORDER — LACTATED RINGERS IV SOLN
INTRAVENOUS | Status: DC | PRN
  Administered 2013-11-02 (×2): via INTRAVENOUS

## 2013-11-02 MED ORDER — ONDANSETRON HCL 4 MG/2ML IJ SOLN
INTRAMUSCULAR | Status: DC | PRN
  Administered 2013-11-02: 4 mg via INTRAVENOUS

## 2013-11-02 MED ORDER — OXYCODONE HCL 5 MG PO TABS
5.0000 mg | ORAL_TABLET | ORAL | Status: DC | PRN
  Administered 2013-11-02: 5 mg via ORAL
  Administered 2013-11-02 – 2013-11-03 (×4): 10 mg via ORAL
  Filled 2013-11-02 (×3): qty 2
  Filled 2013-11-02: qty 1
  Filled 2013-11-02 (×2): qty 2

## 2013-11-02 MED ORDER — ONDANSETRON HCL 4 MG PO TABS: 4.0000 mg | ORAL_TABLET | Freq: Four times a day (QID) | ORAL | Status: DC | PRN

## 2013-11-02 MED ORDER — PHENYLEPHRINE HCL 10 MG/ML IJ SOLN
INTRAMUSCULAR | Status: DC | PRN
  Administered 2013-11-02 (×2): 80 ug via INTRAVENOUS

## 2013-11-02 MED ORDER — PHENOL 1.4 % MT LIQD: 1.0000 | OROMUCOSAL | Status: DC | PRN

## 2013-11-02 MED ORDER — CEFAZOLIN SODIUM 1-5 GM-% IV SOLN
1.0000 g | Freq: Four times a day (QID) | INTRAVENOUS | Status: AC
  Administered 2013-11-02 (×2): 1 g via INTRAVENOUS
  Filled 2013-11-02 (×2): qty 50

## 2013-11-02 MED ORDER — PROMETHAZINE HCL 25 MG/ML IJ SOLN: 6.2500 mg | INTRAMUSCULAR | Status: DC | PRN

## 2013-11-02 MED ORDER — BISACODYL 5 MG PO TBEC: 5.0000 mg | DELAYED_RELEASE_TABLET | Freq: Every day | ORAL | Status: DC | PRN

## 2013-11-02 MED ORDER — CHLORHEXIDINE GLUCONATE 4 % EX LIQD: 60.0000 mL | Freq: Once | CUTANEOUS | Status: DC

## 2013-11-02 MED ORDER — ZOLPIDEM TARTRATE 5 MG PO TABS: 5.0000 mg | ORAL_TABLET | Freq: Every evening | ORAL | Status: DC | PRN

## 2013-11-02 MED ORDER — BUPIVACAINE-EPINEPHRINE 0.5% -1:200000 IJ SOLN
INTRAMUSCULAR | Status: DC | PRN
  Administered 2013-11-02: 30 mL

## 2013-11-02 MED ORDER — FENTANYL CITRATE 0.05 MG/ML IJ SOLN
INTRAMUSCULAR | Status: DC | PRN
  Administered 2013-11-02: 150 ug via INTRAVENOUS

## 2013-11-02 MED ORDER — ROPIVACAINE HCL 5 MG/ML IJ SOLN
INTRAMUSCULAR | Status: DC | PRN
  Administered 2013-11-02: 150 mg via PERINEURAL

## 2013-11-02 MED ORDER — ALUM & MAG HYDROXIDE-SIMETH 200-200-20 MG/5ML PO SUSP: 30.0000 mL | ORAL | Status: DC | PRN

## 2013-11-02 MED ORDER — SENNOSIDES-DOCUSATE SODIUM 8.6-50 MG PO TABS: 1.0000 | ORAL_TABLET | Freq: Every evening | ORAL | Status: DC | PRN

## 2013-11-02 MED ORDER — DEXAMETHASONE SODIUM PHOSPHATE 10 MG/ML IJ SOLN
INTRAMUSCULAR | Status: DC | PRN
  Administered 2013-11-02: 6 mg

## 2013-11-02 MED ORDER — ACETAMINOPHEN 650 MG RE SUPP: 650.0000 mg | Freq: Four times a day (QID) | RECTAL | Status: DC | PRN

## 2013-11-02 MED ORDER — IBUPROFEN 200 MG PO TABS
200.0000 mg | ORAL_TABLET | Freq: Two times a day (BID) | ORAL | Status: DC
  Administered 2013-11-02 – 2013-11-03 (×3): 200 mg via ORAL
  Filled 2013-11-02 (×5): qty 1

## 2013-11-02 MED ORDER — SODIUM CHLORIDE 0.9 % IR SOLN
Status: DC | PRN
  Administered 2013-11-02 (×2): 1000 mL

## 2013-11-02 MED ORDER — SODIUM CHLORIDE 0.9 % IV SOLN: INTRAVENOUS | Status: DC

## 2013-11-02 MED ORDER — SODIUM CHLORIDE 0.9 % IV SOLN
INTRAVENOUS | Status: DC
  Administered 2013-11-02 – 2013-11-03 (×2): via INTRAVENOUS

## 2013-11-02 MED ORDER — MENTHOL 3 MG MT LOZG: 1.0000 | LOZENGE | OROMUCOSAL | Status: DC | PRN

## 2013-11-02 MED ORDER — BOOST / RESOURCE BREEZE PO LIQD: 1.0000 | Freq: Three times a day (TID) | ORAL | Status: DC

## 2013-11-02 MED ORDER — DIPHENHYDRAMINE HCL 12.5 MG/5ML PO ELIX: 12.5000 mg | ORAL_SOLUTION | ORAL | Status: DC | PRN

## 2013-11-02 MED ORDER — DEXAMETHASONE SODIUM PHOSPHATE 4 MG/ML IJ SOLN
INTRAMUSCULAR | Status: DC | PRN
  Administered 2013-11-02: 8 mg via INTRAVENOUS

## 2013-11-02 MED ORDER — FLEET ENEMA 7-19 GM/118ML RE ENEM: 1.0000 | ENEMA | Freq: Once | RECTAL | Status: AC | PRN

## 2013-11-02 MED ORDER — DOCUSATE SODIUM 100 MG PO CAPS
100.0000 mg | ORAL_CAPSULE | Freq: Two times a day (BID) | ORAL | Status: DC
Start: 2013-11-02 — End: 2013-11-03
  Administered 2013-11-02 – 2013-11-03 (×3): 100 mg via ORAL
  Filled 2013-11-02 (×3): qty 1

## 2013-11-02 MED ORDER — METHOCARBAMOL 500 MG PO TABS
500.0000 mg | ORAL_TABLET | Freq: Four times a day (QID) | ORAL | Status: DC | PRN
  Administered 2013-11-03: 500 mg via ORAL
  Filled 2013-11-02: qty 1

## 2013-11-02 MED ORDER — MIDAZOLAM HCL 5 MG/5ML IJ SOLN
INTRAMUSCULAR | Status: DC | PRN
  Administered 2013-11-02: 2 mg via INTRAVENOUS

## 2013-11-02 MED ORDER — HYDROMORPHONE HCL PF 1 MG/ML IJ SOLN: 1.0000 mg | INTRAMUSCULAR | Status: DC | PRN

## 2013-11-02 MED ORDER — BUPIVACAINE LIPOSOME 1.3 % IJ SUSP
20.0000 mL | Freq: Once | INTRAMUSCULAR | Status: DC
  Filled 2013-11-02: qty 20

## 2013-11-02 MED ORDER — AMLODIPINE BESYLATE 5 MG PO TABS
5.0000 mg | ORAL_TABLET | Freq: Every day | ORAL | Status: DC
  Administered 2013-11-03: 5 mg via ORAL
  Filled 2013-11-02: qty 1

## 2013-11-02 MED ORDER — ENOXAPARIN SODIUM 30 MG/0.3ML ~~LOC~~ SOLN
30.0000 mg | Freq: Two times a day (BID) | SUBCUTANEOUS | Status: DC
  Administered 2013-11-03: 30 mg via SUBCUTANEOUS
  Filled 2013-11-02 (×3): qty 0.3

## 2013-11-02 MED ORDER — ONDANSETRON HCL 4 MG/2ML IJ SOLN: 4.0000 mg | Freq: Four times a day (QID) | INTRAMUSCULAR | Status: DC | PRN

## 2013-11-02 MED ORDER — HYDROMORPHONE HCL PF 1 MG/ML IJ SOLN: 0.2500 mg | INTRAMUSCULAR | Status: DC | PRN

## 2013-11-02 MED ORDER — PROPOFOL 10 MG/ML IV BOLUS
INTRAVENOUS | Status: DC | PRN
  Administered 2013-11-02: 130 mg via INTRAVENOUS

## 2013-11-02 MED ORDER — EPHEDRINE SULFATE 50 MG/ML IJ SOLN
INTRAMUSCULAR | Status: DC | PRN
  Administered 2013-11-02 (×2): 2.5 mg via INTRAVENOUS
  Administered 2013-11-02 (×2): 5 mg via INTRAVENOUS
  Administered 2013-11-02: 10 mg via INTRAVENOUS
  Administered 2013-11-02: 5 mg via INTRAVENOUS
  Administered 2013-11-02: 10 mg via INTRAVENOUS
  Administered 2013-11-02: 5 mg via INTRAVENOUS

## 2013-11-02 MED ORDER — METOCLOPRAMIDE HCL 10 MG PO TABS: 5.0000 mg | ORAL_TABLET | Freq: Three times a day (TID) | ORAL | Status: DC | PRN

## 2013-11-02 MED ORDER — ACETAMINOPHEN 325 MG PO TABS: 650.0000 mg | ORAL_TABLET | Freq: Four times a day (QID) | ORAL | Status: DC | PRN

## 2013-11-02 MED ORDER — BUPIVACAINE-EPINEPHRINE (PF) 0.5% -1:200000 IJ SOLN
INTRAMUSCULAR | Status: AC
  Filled 2013-11-02: qty 10

## 2013-11-02 MED ORDER — METHOCARBAMOL 100 MG/ML IJ SOLN
500.0000 mg | Freq: Four times a day (QID) | INTRAVENOUS | Status: DC | PRN
  Filled 2013-11-02: qty 5

## 2013-11-02 SURGICAL SUPPLY — 59 items
BANDAGE ESMARK 6X9 LF (GAUZE/BANDAGES/DRESSINGS) ×1 IMPLANT
BLADE SAGITTAL 13X1.27X60 (BLADE) ×2 IMPLANT
BLADE SAW SGTL 83.5X18.5 (BLADE) ×2 IMPLANT
BLADE SURG 10 STRL SS (BLADE) ×1 IMPLANT
BNDG CMPR 9X6 STRL LF SNTH (GAUZE/BANDAGES/DRESSINGS) ×1
BNDG ESMARK 6X9 LF (GAUZE/BANDAGES/DRESSINGS) ×2
BOWL SMART MIX CTS (DISPOSABLE) ×2 IMPLANT
CAP POR TM CP VIT E LN CER HD ×1 IMPLANT
CEMENT BONE SIMPLEX SPEEDSET (Cement) ×4 IMPLANT
CLOTH BEACON ORANGE TIMEOUT ST (SAFETY) ×2 IMPLANT
COVER SURGICAL LIGHT HANDLE (MISCELLANEOUS) ×2 IMPLANT
CUFF TOURNIQUET SINGLE 18IN (TOURNIQUET CUFF) ×1 IMPLANT
CUFF TOURNIQUET SINGLE 34IN LL (TOURNIQUET CUFF) ×1 IMPLANT
DRAPE EXTREMITY T 121X128X90 (DRAPE) ×2 IMPLANT
DRAPE INCISE IOBAN 66X45 STRL (DRAPES) ×4 IMPLANT
DRAPE PROXIMA HALF (DRAPES) ×2 IMPLANT
DRAPE U-SHAPE 47X51 STRL (DRAPES) ×2 IMPLANT
DRSG ADAPTIC 3X8 NADH LF (GAUZE/BANDAGES/DRESSINGS) ×2 IMPLANT
DRSG PAD ABDOMINAL 8X10 ST (GAUZE/BANDAGES/DRESSINGS) ×2 IMPLANT
DURAPREP 26ML APPLICATOR (WOUND CARE) ×4 IMPLANT
ELECT REM PT RETURN 9FT ADLT (ELECTROSURGICAL) ×2
ELECTRODE REM PT RTRN 9FT ADLT (ELECTROSURGICAL) ×1 IMPLANT
EVACUATOR 1/8 PVC DRAIN (DRAIN) ×2 IMPLANT
GLOVE BIOGEL M 7.0 STRL (GLOVE) IMPLANT
GLOVE BIOGEL PI IND STRL 7.5 (GLOVE) IMPLANT
GLOVE BIOGEL PI IND STRL 8.5 (GLOVE) ×2 IMPLANT
GLOVE BIOGEL PI INDICATOR 7.5 (GLOVE)
GLOVE BIOGEL PI INDICATOR 8.5 (GLOVE) ×2
GLOVE SURG ORTHO 8.0 STRL STRW (GLOVE) ×4 IMPLANT
GOWN PREVENTION PLUS XLARGE (GOWN DISPOSABLE) ×5 IMPLANT
GOWN STRL NON-REIN LRG LVL3 (GOWN DISPOSABLE) ×2 IMPLANT
HANDPIECE INTERPULSE COAX TIP (DISPOSABLE) ×2
HOOD PEEL AWAY FACE SHEILD DIS (HOOD) ×7 IMPLANT
KIT BASIN OR (CUSTOM PROCEDURE TRAY) ×2 IMPLANT
KIT ROOM TURNOVER OR (KITS) ×2 IMPLANT
MANIFOLD NEPTUNE II (INSTRUMENTS) ×2 IMPLANT
NDL HYPO 21X1.5 SAFETY (NEEDLE) ×1 IMPLANT
NEEDLE 22X1 1/2 (OR ONLY) (NEEDLE) ×2 IMPLANT
NEEDLE HYPO 21X1.5 SAFETY (NEEDLE) ×2 IMPLANT
NS IRRIG 1000ML POUR BTL (IV SOLUTION) ×2 IMPLANT
PACK TOTAL JOINT (CUSTOM PROCEDURE TRAY) ×2 IMPLANT
PAD ABD 8X10 STRL (GAUZE/BANDAGES/DRESSINGS) ×1 IMPLANT
PAD ARMBOARD 7.5X6 YLW CONV (MISCELLANEOUS) ×4 IMPLANT
PADDING CAST COTTON 6X4 STRL (CAST SUPPLIES) ×2 IMPLANT
SET HNDPC FAN SPRY TIP SCT (DISPOSABLE) ×1 IMPLANT
SPONGE GAUZE 4X4 12PLY (GAUZE/BANDAGES/DRESSINGS) ×2 IMPLANT
STAPLER VISISTAT 35W (STAPLE) ×2 IMPLANT
SUCTION FRAZIER TIP 10 FR DISP (SUCTIONS) ×2 IMPLANT
SUT BONE WAX W31G (SUTURE) ×2 IMPLANT
SUT VIC AB 0 CTB1 27 (SUTURE) ×4 IMPLANT
SUT VIC AB 1 CT1 27 (SUTURE) ×4
SUT VIC AB 1 CT1 27XBRD ANBCTR (SUTURE) ×2 IMPLANT
SUT VIC AB 2-0 CT1 27 (SUTURE) ×4
SUT VIC AB 2-0 CT1 TAPERPNT 27 (SUTURE) ×2 IMPLANT
SYR CONTROL 10ML LL (SYRINGE) ×2 IMPLANT
TOWEL OR 17X24 6PK STRL BLUE (TOWEL DISPOSABLE) ×2 IMPLANT
TOWEL OR 17X26 10 PK STRL BLUE (TOWEL DISPOSABLE) ×2 IMPLANT
TRAY FOLEY CATH 16FRSI W/METER (SET/KITS/TRAYS/PACK) ×1 IMPLANT
WATER STERILE IRR 1000ML POUR (IV SOLUTION) ×4 IMPLANT

## 2013-11-02 NOTE — Op Note (Signed)
TOTAL KNEE REPLACEMENT OPERATIVE NOTE:  11/02/2013  2:29 PM  PATIENT:  Walter Patrick  79 y.o. male  PRE-OPERATIVE DIAGNOSIS:  osteoarthritis left knee  POST-OPERATIVE DIAGNOSIS:  osteoarthritis left knee  PROCEDURE:  Procedure(s): TOTAL KNEE ARTHROPLASTY  SURGEON:  Surgeon(s): Dannielle Huh, MD  PHYSICIAN ASSISTANT: Altamese Cabal, [facility]  ANESTHESIA:   general  DRAINS: Hemovac  SPECIMEN: None  COUNTS:  Correct  TOURNIQUET:   Total Tourniquet Time Documented: Thigh (Left) - 59 minutes Total: Thigh (Left) - 59 minutes   DICTATION:  Indication for procedure:    The patient is a 79 y.o. male who has failed conservative treatment for osteoarthritis left knee.  Informed consent was obtained prior to anesthesia. The risks versus benefits of the operation were explain and in a way the patient can, and did, understand.   On the implant demand matching protocol, this patient scored 10.  Therefore, this patient was not receive a polyethylene insert with vitamin E which is a high demand implant.  Description of procedure:     The patient was taken to the operating room and placed under anesthesia.  The patient was positioned in the usual fashion taking care that all body parts were adequately padded and/or protected.  I foley catheter was not placed.  A tourniquet was applied and the leg prepped and draped in the usual sterile fashion.  The extremity was exsanguinated with the esmarch and tourniquet inflated to 350 mmHg.  Pre-operative range of motion was normal.  The knee was in 5 degree of mild varus.  A midline incision approximately 6-7 inches long was made with a #10 blade.  A new blade was used to make a parapatellar arthrotomy going 2-3 cm into the quadriceps tendon, over the patella, and alongside the medial aspect of the patellar tendon.  A synovectomy was then performed with the #10 blade and forceps. I then elevated the deep MCL off the medial tibial metaphysis subperiosteally  around to the semimembranosus attachment.    I everted the patella and used calipers to measure patellar thickness.  I used the reamer to ream down to appropriate thickness to recreate the native thickness.  I then removed excess bone with the rongeur and sagittal saw.  I used the appropriately sized template and drilled the three lug holes.  I then put the trial in place and measured the thickness with the calipers to ensure recreation of the native thickness.  The trial was then removed and the patella subluxed and the knee brought into flexion.  A homan retractor was place to retract and protect the patella and lateral structures.  A Z-retractor was place medially to protect the medial structures.  The extra-medullary alignment system was used to make cut the tibial articular surface perpendicular to the anamotic axis of the tibia and in 3 degrees of posterior slope.  The cut surface and alignment jig was removed.  I then used the intramedullary alignment guide to make a 6 valgus cut on the distal femur.  I then marked out the epicondylar axis on the distal femur.  The posterior condylar axis measured 3 degrees.  I then used the anterior referencing sizer and measured the femur to be a size 10.  The 4-In-1 cutting block was screwed into place in external rotation matching the posterior condylar angle, making our cuts perpendicular to the epicondylar axis.  Anterior, posterior and chamfer cuts were made with the sagittal saw.  The cutting block and cut pieces were removed.  A  lamina spreader was placed in 90 degrees of flexion.  The ACL, PCL, menisci, and posterior condylar osteophytes were removed.  A 13 mm spacer blocked was found to offer good flexion and extension gap balance after minimal in degree releasing.   The scoop retractor was then placed and the femoral finishing block was pinned in place.  The small sagittal saw was used as well as the lug drill to finish the femur.  The block and cut  surfaces were removed and the medullary canal hole filled with autograft bone from the cut pieces.  The tibia was delivered forward in deep flexion and external rotation.  A size G tray was selected and pinned into place centered on the medial 1/3 of the tibial tubercle.  The reamer and keel was used to prepare the tibia through the tray.    I then trialed with the size 10 femur, size G tibia, a 13 mm insert and the 35 patella.  I had excellent flexion/extension gap balance, excellent patella tracking.  Flexion was full and beyond 120 degrees; extension was zero.  These components were chosen and the staff opened them to me on the back table while the knee was lavaged copiously and the cement mixed.  The soft tissue was infiltrated with 60cc of exparel 1.3% through a 21 gauge needle.  I cemented in the components and removed all excess cement.  The polyethylene tibial component was snapped into place and the knee placed in extension while cement was hardening.  The capsule was infilltrated with 30cc of .25% Marcaine with epinephrine.  A hemovac was place in the joint exiting superolaterally.  A pain pump was place superomedially superficial to the arthrotomy.  Once the cement was hard, the tourniquet was let down.  Hemostasis was obtained.  The arthrotomy was closed with figure-8 #1 vicryl sutures.  The deep soft tissues were closed with #0 vicryls and the subcuticular layer closed with a running #2-0 vicryl.  The skin was reapproximated and closed with skin staples.  The wound was dressed with xeroform, 4 x4's, 2 ABD sponges, a single layer of webril and a TED stocking.   The patient was then awakened, extubated, and taken to the recovery room in stable condition.  BLOOD LOSS:  300cc DRAINS: 1 hemovac, 1 pain catheter COMPLICATIONS:  None.  PLAN OF CARE: Admit to inpatient   PATIENT DISPOSITION:  PACU - hemodynamically stable.   Delay start of Pharmacological VTE agent (>24hrs) due to surgical  blood loss or risk of bleeding:  not applicable  Please fax a copy of this op note to my office at [phone] (please only include page 1 and 2 of the Case Information op note)
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
## 2013-11-02 NOTE — Preoperative (Signed)
Beta Blockers   Reason not to administer Beta Blockers:Not Applicable  Pt took am lopressor 0600.

## 2013-11-02 NOTE — Anesthesia Procedure Notes (Addendum)
Anesthesia Regional Block:  Adductor canal block  Pre-Anesthetic Checklist: ,, timeout performed, Correct Patient, Correct Site, Correct Laterality, Correct Procedure, Correct Position, site marked, Risks and benefits discussed,  Surgical consent,  Pre-op evaluation,  At surgeon's request and post-op pain management  Laterality: Left  Prep: chloraprep       Needles:  Injection technique: Single-shot  Needle Type: Stimulator Needle - 40     Needle Length: 5cm 5 cm Needle Gauge: 22 and 22 G    Additional Needles:  Procedures: ultrasound guided (picture in chart) Adductor canal block Narrative:  Start time: 11/02/2013 7:03 AM End time: 11/02/2013 7:13 AM  Performed by: Personally

## 2013-11-02 NOTE — Progress Notes (Signed)
Orthopedic Tech Progress Note Patient Details:  Walter Patrick [facility] 11-Jan-1932 [id number] CPM applied to Left LE with appropriate settings. OHF applied to bed. Footsie roll provided.  CPM Left Knee CPM Left Knee: On Left Knee Flexion (Degrees): 90 Left Knee Extension (Degrees): 0   Asia R Thompson 11/02/2013, 10:18 AM

## 2013-11-02 NOTE — Transfer of Care (Signed)
Immediate Anesthesia Transfer of Care Note  Patient: Walter Patrick  Procedure(s) Performed: Procedure(s): TOTAL KNEE ARTHROPLASTY (Left)  Patient Location: PACU  Anesthesia Type:General  Level of Consciousness: sedated and responds to stimulation  Airway & Oxygen Therapy: Patient Spontanous Breathing and Patient connected to nasal cannula oxygen  Post-op Assessment: Report given to PACU RN, Post -op Vital signs reviewed and stable and Patient moving all extremities X 4  Post vital signs: Reviewed and stable  Complications: No apparent anesthesia complications

## 2013-11-02 NOTE — Care Management Note (Signed)
CARE MANAGEMENT NOTE 11/02/2013  Patient:  Walter Patrick, Walter Patrick   Account Number:  [id number]  Date Initiated:  11/02/2013  Documentation initiated by:  Vance Peper  Subjective/Objective Assessment:   79 yr old male s/p left total knee arthroplasty.     Action/Plan:   PT/OT eval.  Patient preoperatively setup with [facility]. TNT to deliver equipment. CM will follow.   Anticipated DC Date:     Anticipated DC Plan:  HOME W HOME HEALTH SERVICES      DC Planning Services  CM consult      Choice offered to / List presented to:             Status of service:  In process, will continue to follow
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
## 2013-11-02 NOTE — Progress Notes (Signed)
Patient has a loop device implanted to monitor arrhythmias. Patient has a device that alarms him but did not bring with him. MD notified and ordered telemetry monitoring while on 5N until the daughter brings in the device. Daughter was notified by Blane Ohara and daughter stated she will bring it later. Patient will continue on telemetry monitoring until device arrives.

## 2013-11-02 NOTE — Anesthesia Preprocedure Evaluation (Addendum)
Anesthesia Evaluation  Patient identified by MRN, date of birth, ID band Patient awake    Reviewed: Allergy & Precautions, H&P , NPO status , Patient's Chart, lab work & pertinent test results  Airway Mallampati: II TM Distance: >3 FB Neck ROM: Full    Dental  (+) Teeth Intact and Dental Advisory Given   Pulmonary neg pulmonary ROS,    Pulmonary exam normal       Cardiovascular hypertension, Pt. on home beta blockers  Echo showed Normal LV size. Mild DUST-No LV outfolw obstruction. Systolic function normal. EF>55%. No regional wall abn. Mild regurg in mitral and pulmonic valves, but no significant valvular disease noted.   Neuro/Psych negative neurological ROS     GI/Hepatic negative GI ROS, Neg liver ROS,   Endo/Other  negative endocrine ROS  Renal/GU negative Renal ROS     Musculoskeletal   Abdominal   Peds  Hematology   Anesthesia Other Findings   Reproductive/Obstetrics                          Anesthesia Physical Anesthesia Plan  ASA: III  Anesthesia Plan: General   Post-op Pain Management:    Induction: Intravenous  Airway Management Planned: LMA  Additional Equipment:   Intra-op Plan:   Post-operative Plan: Extubation in OR  Informed Consent: I have reviewed the patients History and Physical, chart, labs and discussed the procedure including the risks, benefits and alternatives for the proposed anesthesia with the patient or authorized representative who has indicated his/her understanding and acceptance.   Dental advisory given  Plan Discussed with: CRNA, Anesthesiologist and Surgeon  Anesthesia Plan Comments:        Anesthesia Quick Evaluation

## 2013-11-02 NOTE — Plan of Care (Signed)
Problem: Consults Goal: Diagnosis- Total Joint Replacement Primary Total Knee Left     

## 2013-11-02 NOTE — Anesthesia Postprocedure Evaluation (Signed)
Anesthesia Post Note  Patient: Walter Patrick  Procedure(s) Performed: Procedure(s) (LRB): TOTAL KNEE ARTHROPLASTY (Left)  Anesthesia type: general  Patient location: PACU  Post pain: Pain level controlled  Post assessment: Patient's Cardiovascular Status Stable  Last Vitals:  Filed Vitals:   11/02/13 1057  BP: 131/66  Pulse: 72  Temp: 36.4 C  Resp: 17    Post vital signs: Reviewed and stable  Level of consciousness: sedated  Complications: No apparent anesthesia complications

## 2013-11-02 NOTE — Progress Notes (Signed)
Utilization review completed.  

## 2013-11-02 NOTE — Evaluation (Signed)
Physical Therapy Evaluation Patient Details Name: Walter Patrick MRN: [id number] DOB: [birth year] Today's Date: 11/02/2013 Time: 1410-1430 PT Time Calculation (min): 20 min  PT Assessment / Plan / Recommendation History of Present Illness  LTKA  Clinical Impression  Pt is s/p TKA resulting in the deficits listed below (see PT Problem List).  Pt will benefit from skilled PT to increase their independence and safety with mobility to allow discharge to the venue listed below.      PT Assessment  Patient needs continued PT services    Follow Up Recommendations  Home health PT;Supervision/Assistance - 24 hour    Does the patient have the potential to tolerate intense rehabilitation      Barriers to Discharge   Would pt's son and/or daughter be able to stay overnight with pt once home?    Equipment Recommendations  Rolling walker with 5" wheels;3in1 (PT)    Recommendations for Other Services OT consult   Frequency 7X/week    Precautions / Restrictions Precautions Precautions: Knee Restrictions LLE Weight Bearing: Weight bearing as tolerated   Pertinent Vitals/Pain 2/10 L knee pain at end of session patient repositioned for comfort and optimal knee extension      Mobility  Bed Mobility Bed Mobility: Supine to Sit;Sitting - Scoot to Edge of Bed Supine to Sit: 4: Min guard Sitting - Scoot to [place] of Bed: 4: Min guard Details for Bed Mobility Assistance: Cues for technique Transfers Transfers: Sit to Stand;Stand to Sit Sit to Stand: 4: Min assist Stand to Sit: 4: Min assist Details for Transfer Assistance: Cues for technique and positioning for comfort Ambulation/Gait Ambulation/Gait Assistance: 4: Min assist Ambulation Distance (Feet): 12 Feet Assistive device: Rolling walker Ambulation/Gait Assistance Details: Cues for gait sequence and to activate L quad for stance stability Gait Pattern: Step-to pattern    Exercises Total Joint Exercises Quad Sets: AROM;Left;5  reps Heel Slides: AAROM;Left;5 reps   PT Diagnosis: Difficulty walking;Acute pain  PT Problem List: Decreased strength;Decreased range of motion;Decreased activity tolerance;Decreased balance;Decreased mobility;Decreased knowledge of use of DME;Pain;Decreased knowledge of precautions PT Treatment Interventions: DME instruction;Gait training;Stair training;Functional mobility training;Therapeutic activities;Therapeutic exercise;Patient/family education     PT Goals(Current goals can be found in the care plan section) Acute Rehab PT Goals Patient Stated Goal: wals PT Goal Formulation: With patient Time For Goal Achievement: 11/09/13 Potential to Achieve Goals: Good  Visit Information  Last PT Received On: 11/02/13 Assistance Needed: +1 History of Present Illness: LTKA       Prior Functioning  Home Living Family/patient expects to be discharged to:: Private residence Living Arrangements: Alone Available Help at Discharge: Family;Available PRN/intermittently (children live across the street) Type of Home: House Home Access: Stairs to enter [employer] of Steps: 4 Entrance Stairs-Rails: Right Home Layout: Two level;Able to live on main level with bedroom/bathroom Alternate Level Stairs-Number of Steps: flight Alternate Level Stairs-Rails: Right Home Equipment: Walker - 2 wheels Additional Comments: states he will not go upstairs Prior Function Level of Independence: Independent with assistive device(s) Communication Communication: No difficulties    Cognition  Cognition Arousal/Alertness: Awake/alert Behavior During Therapy: WFL for tasks assessed/performed Overall Cognitive Status: Within Functional Limits for tasks assessed    Extremity/Trunk Assessment Upper Extremity Assessment Upper Extremity Assessment: Overall WFL for tasks assessed Lower Extremity Assessment Lower Extremity Assessment: LLE deficits/detail LLE Deficits / Details: Grossly decr AROM and  strength, limited by pain postop; Quad set present   Balance    End of Session PT - End of Session Activity  Tolerance: Patient tolerated treatment well Patient left: in chair;with call bell/phone within reach Nurse Communication: Mobility status  GP     Van Clines [facility] [place], [place] [phone]  11/02/2013, 4:11 PM

## 2013-11-03 ENCOUNTER — Encounter (HOSPITAL_COMMUNITY): Payer: Self-pay | Admitting: Orthopedic Surgery

## 2013-11-03 DIAGNOSIS — E43 Unspecified severe protein-calorie malnutrition: Secondary | ICD-10-CM | POA: Insufficient documentation

## 2013-11-03 LAB — BASIC METABOLIC PANEL
Calcium: 9 mg/dL (ref 8.4–10.5)
GFR calc Af Amer: >90 mL/min (ref >90)
GFR calc non Af Amer: 90 mL/min — ABNORMAL LOW (ref >90)
Glucose, Bld: 80 mg/dL (ref 70–99)
Potassium: 4.1 mEq/L (ref 3.5–5.1)
Sodium: 134 mEq/L — ABNORMAL LOW (ref 135–145)

## 2013-11-03 LAB — CBC
HCT: 32 % — ABNORMAL LOW (ref 39.0–52.0)
Hemoglobin: 11.2 g/dL — ABNORMAL LOW (ref 13.0–17.0)
MCH: 33.1 pg (ref 26.0–34.0)
MCHC: 35 g/dL (ref 30.0–36.0)
Platelets: 244 10*3/uL (ref 150–400)
RDW: 12.9 % (ref 11.5–15.5)
WBC: 13 10*3/uL — ABNORMAL HIGH (ref 4.0–10.5)

## 2013-11-03 MED ORDER — OXYCODONE HCL 5 MG PO TABS: 5.0000 mg | ORAL_TABLET | ORAL | Status: DC | PRN

## 2013-11-03 MED ORDER — CELECOXIB 200 MG PO CAPS: 200.0000 mg | ORAL_CAPSULE | Freq: Two times a day (BID) | ORAL | Status: DC

## 2013-11-03 MED ORDER — ENOXAPARIN SODIUM 40 MG/0.4ML ~~LOC~~ SOLN: 40.0000 mg | SUBCUTANEOUS | Status: DC

## 2013-11-03 NOTE — Evaluation (Signed)
Occupational Therapy Evaluation Patient Details Name: Walter Patrick MRN: [id number] DOB: 25-Apr-1932 Today's Date: 11/03/2013 Time: 7829-5621 OT Time Calculation (min): 24 min  OT Assessment / Plan / Recommendation History of present illness LTKA   Clinical Impression   Pt is 79y/o male s/p L TKA. He is currently supervision level transfers for safety, sequencing and hand placement. He reports that he is d/c'ing home later today w/ family PRN assist (son & daughter to stay w/ pt). Pt reports no further acute OT needs at this time, will sign off.    OT Assessment  Patient does not need any further OT services    Follow Up Recommendations  No OT follow up;Supervision/Assistance - 24 hour. Pt reports that he has clawfoot tub at home and plans to sponge bathe until he is able to use standard tub in the upstairs of his house (has shower chair).   Barriers to Discharge      Equipment Recommendations  None recommended by OT;Other: Pt declines 3:1 "I don't need it"   Recommendations for Other Services    Frequency       Precautions / Restrictions Precautions Precautions: Knee Restrictions Weight Bearing Restrictions: Yes LLE Weight Bearing: Weight bearing as tolerated   Pertinent Vitals/Pain 4/10 L knee pain. Pt reports that he had pain medication prior to therapy session.    ADL  Eating/Feeding: Performed;Independent Where Assessed - Eating/Feeding: Chair Grooming: Performed;Wash/dry hands;Wash/dry face;Teeth care;Supervision/safety (Standing at sink) Where Assessed - Grooming: Supported standing;Unsupported standing Upper Body Bathing: Performed;Right arm;Left arm;Chest;Supervision/safety (Standing at sink) Where Assessed - Upper Body Bathing: Unsupported standing Lower Body Bathing: Simulated;Supervision/safety Where Assessed - Lower Body Bathing: Supported sitting;Supported sit to stand Upper Body Dressing: Simulated;Modified independent Where Assessed - Upper Body Dressing:  Unsupported sitting;Supported sitting Lower Body Dressing: Performed;Supervision/safety (Sitting in chair to don/doff socks then standing) Where Assessed - Lower Body Dressing: Unsupported sitting;Supported sit to stand (w/o a/e) Toilet Transfer: Performed;Supervision/safety (Amb into bathroom) Toilet Transfer Method: Sit to Pharmacist, hospital Equipment: Comfort height toilet;Other (comment) (Pt holds onto middle of RW & reaches back for toilet seat) Toileting - Clothing Manipulation and Hygiene: Performed;Supervision/safety Where Assessed - Toileting Clothing Manipulation and Hygiene: Sit to stand from 3-in-1 or toilet;Standing Tub/Shower Transfer Method: Not assessed Equipment Used: Gait belt;Rolling walker;Other (comment) (Comfort height toilet) Transfers/Ambulation Related to ADLs: Pt overall supervision level for functional transfers using RW. Pt benefits from vc's for safety, sequencing and hand placement. He ambulates w/ left knee flexed (discussed w/ PT) ADL Comments: Pt seen for ADL retraining session for acute OT. Pt was educated in role of OT and recommendations for LB bathing/dressing seated. Pt able to complete dressing w/o a/e noted. Discussed toilet transfers w/ pt whom declined 3:1 for home. Pt able to demonstrate transfer @ supervision level putting one hand in middle of RW and reaching back to toilet seat w/ other. Pt gets up from toilet the same way stating "This is the way I've done it for years now" Discussed tub transfers w/ pt whom reports he has clawfoot tub. Pt has standard tub on second level of his home w/ shower chair and plans to sponge bathe until he can "go upstairs to shower" . Pt reports no further acute OT needs at this time.    OT Diagnosis:    OT Problem List:   OT Treatment Interventions:     OT Goals(Current goals can be found in the care plan section) Acute Rehab OT Goals Patient Stated Goal: I am going  home at 6 O'Clock tonight"  Visit Information   Last OT Received On: 11/03/13 Assistance Needed: +1 History of Present Illness: LTKA       Prior Functioning     Home Living Family/patient expects to be discharged to:: Private residence Living Arrangements: Alone Available Help at Discharge: Family;Available PRN/intermittently Type of Home: House Home Access: Stairs to enter [employer] of Steps: 4 Entrance Stairs-Rails: Right Home Layout: Two level;Able to live on main level with bedroom/bathroom Alternate Level Stairs-Number of Steps: flight Alternate Level Stairs-Rails: Right Home Equipment: Walker - 2 wheels;Grab bars - toilet Additional Comments: states he will not go upstairs Prior Function Level of Independence: Independent with assistive device(s) Communication Communication: No difficulties Dominant Hand: Right    Vision/Perception Vision - History Baseline Vision: Wears glasses only for reading Patient Visual Report: No change from baseline   Cognition  Cognition Arousal/Alertness: Awake/alert Behavior During Therapy: WFL for tasks assessed/performed Overall Cognitive Status: Within Functional Limits for tasks assessed    Extremity/Trunk Assessment Upper Extremity Assessment Upper Extremity Assessment: Overall WFL for tasks assessed Lower Extremity Assessment Lower Extremity Assessment: Defer to PT evaluation    Mobility Bed Mobility Bed Mobility: Not assessed (Pt up in chair) Transfers Transfers: Sit to Stand;Stand to Sit Sit to Stand: 5: Supervision;From chair/3-in-1;From toilet;With upper extremity assist Stand to Sit: 5: Supervision;To chair/3-in-1;To toilet;With armrests;With upper extremity assist Details for Transfer Assistance: Cues for technique and positioning for comfort        Balance Balance Balance Assessed: Yes Static Sitting Balance Static Sitting - Balance Support: No upper extremity supported;Feet supported Static Standing Balance Static Standing - Balance Support:  Bilateral upper extremity supported;No upper extremity supported;During functional activity   End of Session OT - End of Session Equipment Utilized During Treatment: Gait belt;Rolling walker;Other (comment) (comfort height toilet) Activity Tolerance: Patient tolerated treatment well Patient left: in chair;with call bell/phone within reach  GO     Alm Bustard 11/03/2013, 11:43 AM

## 2013-11-03 NOTE — Progress Notes (Signed)
Physical Therapy Treatment Patient Details Name: Walter Patrick MRN: [id number] DOB: 1932-09-02 Today's Date: 11/03/2013 Time: 4098-1191 PT Time Calculation (min): 14 min  PT Assessment / Plan / Recommendation  History of Present Illness LTKA   PT Comments   Pt cont's to progress with mobility.  Educated on stairs.  Pt states he did not have to go up/down flight of steps therefore only practiced 5 to simulate getting in/out of house.     Follow Up Recommendations  Home health PT;Supervision/Assistance - 24 hour     Does the patient have the potential to tolerate intense rehabilitation     Barriers to Discharge        Equipment Recommendations  Rolling walker with 5" wheels;3in1 (PT)    Recommendations for Other Services OT consult  Frequency 7X/week   Progress towards PT Goals Progress towards PT goals: Progressing toward goals  Plan Current plan remains appropriate    Precautions / Restrictions Precautions Precautions: Knee Restrictions Weight Bearing Restrictions: Yes LLE Weight Bearing: Weight bearing as tolerated   Pertinent Vitals/Pain 3/10      Mobility  Bed Mobility Bed Mobility: Not assessed Supine to Sit: 5: Supervision;HOB flat Sitting - Scoot to Edge of Bed: 5: Supervision Transfers Transfers: Sit to Stand;Stand to Sit Sit to Stand: With upper extremity assist;5: Supervision;With armrests;From chair/3-in-1 Stand to Sit: 5: Supervision;With upper extremity assist;With armrests;To chair/3-in-1 Details for Transfer Assistance: cues for safe body positioning before sitting Ambulation/Gait Ambulation/Gait Assistance: 4: Min guard Ambulation Distance (Feet): 150 Feet Assistive device: Rolling walker Ambulation/Gait Assistance Details: cues for posture, safe body positioning inside RW, increased heel strike & terminal knee extension LLE.   Gait Pattern: Step-to pattern;Decreased stance time - left;Decreased step length - right Gait velocity: decreased General  Gait Details: takes quick step with RLE Stairs: Yes Stairs Assistance: 4: Min guard Stairs Assistance Details (indicate cue type and reason): cues for sequencing & technique.  bil UE support on Lt rail.   Stair Management Technique: One rail Left;Step to pattern;Forwards Number of Stairs: 5 Wheelchair Mobility Wheelchair Mobility: No    Exercises Total Joint Exercises Ankle Circles/Pumps: AROM;Both;10 reps Quad Sets: AROM;Strengthening;Both;10 reps Heel Slides: AROM;Strengthening;Left;10 reps Hip ABduction/ADduction: Strengthening;Left;10 reps;AAROM Straight Leg Raises: AAROM;Strengthening;Left;10 reps Long Arc Quad: AAROM;Strengthening;Left;10 reps     PT Goals (current goals can now be found in the care plan section) Acute Rehab PT Goals Patient Stated Goal: I am going home at 6 O'Clock tonight" PT Goal Formulation: With patient Time For Goal Achievement: 11/09/13 Potential to Achieve Goals: Good  Visit Information  Last PT Received On: 11/03/13 Assistance Needed: +1 History of Present Illness: LTKA    Subjective Data  Patient Stated Goal: I am going home at 6 O'Clock tonight"   Cognition  Cognition Arousal/Alertness: Awake/alert Behavior During Therapy: WFL for tasks assessed/performed Overall Cognitive Status: Within Functional Limits for tasks assessed    Balance  Balance Balance Assessed: Yes Static Sitting Balance Static Sitting - Balance Support: No upper extremity supported;Feet supported Static Standing Balance Static Standing - Balance Support: Bilateral upper extremity supported;No upper extremity supported;During functional activity  End of Session PT - End of Session Equipment Utilized During Treatment: Gait belt Activity Tolerance: Patient tolerated treatment well Patient left: in chair;with call bell/phone within reach Nurse Communication: Mobility status CPM Left Knee CPM Left Knee: On Left Knee Flexion (Degrees): 90 Left Knee Extension  (Degrees): 0   GP     Lara Mulch 11/03/2013, 1:42 PM  Verdell Face, PTA  [phone] 11/03/2013    

## 2013-11-03 NOTE — Progress Notes (Signed)
SPORTS MEDICINE AND JOINT REPLACEMENT  Georgena Spurling, MD   Altamese Cabal, PA-C [address] [place], [place], [place]  [address]                             [phone]   PROGRESS NOTE  Subjective:  negative for Chest Pain  negative for Shortness of Breath  negative for Nausea/Vomiting   negative for Calf Pain  negative for Bowel Movement   Tolerating Diet: yes         Patient reports pain as 4 on 0-10 scale.    Objective: Vital signs in last 24 hours:   Patient Vitals for the past 24 hrs:  BP Temp Temp src Pulse Resp SpO2  11/03/13 1500 103/50 mmHg 97.8 F (36.6 C) - 62 18 100 %  11/03/13 0549 131/62 mmHg 97.6 F (36.4 C) - 66 18 95 %  11/03/13 0200 111/54 mmHg 97.7 F (36.5 C) - 60 18 95 %  11/02/13 2045 114/54 mmHg 97.2 F (36.2 C) Oral 58 18 98 %  11/02/13 1900 120/62 mmHg 97.1 F (36.2 C) - 76 17 98 %  11/02/13 1600 - - - - 18 -    @flow {1959:LAST@   Intake/Output from previous day:   12/15 0701 - 12/16 0700 In: 2900 [P.O.:600; I.V.:2300] Out: 2645 [Urine:2175; Drains:450]   Intake/Output this shift:   12/16 0701 - 12/16 1900 In: 480 [P.O.:480] Out: 450 [Urine:250; Drains:200]   Intake/Output     12/15 0701 - 12/16 0700 12/16 0701 - 12/17 0700   P.O. 600 480   I.V. 2300    Total Intake 2900 480   Urine 2175 250   Drains 450 200   Blood 20    Total Output 2645 450   Net +255 +30           LABORATORY DATA:  Recent Labs  11/02/13 1146 11/03/13 0410  WBC 6.2 13.0*  HGB 12.5* 11.2*  HCT 36.3* 32.0*  PLT 215 244    Recent Labs  11/02/13 1146 11/03/13 0410  NA  --  134*  K  --  4.1  CL  --  100  CO2  --  24  BUN  --  10  CREATININE 0.67 0.66  GLUCOSE  --  80  CALCIUM  --  9.0   Lab Results  Component Value Date   INR 1.00 10/20/2013   INR 1.03 08/31/2013    Examination:  General appearance: alert, cooperative and no distress Extremities: Homans sign is negative, no sign of DVT  Wound Exam: clean, dry, intact   Drainage:   None: wound tissue dry  Motor Exam: EHL and FHL Intact  Sensory Exam: Deep Peroneal normal   Assessment:    1 Day Post-Op  Procedure(s) (LRB): TOTAL KNEE ARTHROPLASTY (Left)  ADDITIONAL DIAGNOSIS:  Active Problems:   S/P total knee arthroplasty   Protein-calorie malnutrition, severe  Acute Blood Loss Anemia   Plan: Physical Therapy as ordered Weight Bearing as Tolerated (WBAT)  DVT Prophylaxis:  Lovenox  DISCHARGE PLAN: Home  DISCHARGE NEEDS: HHPT, CPM, Walker and 3-in-1 comode seat         Lille Karim 11/03/2013, 3:48 PM

## 2013-11-03 NOTE — Progress Notes (Signed)
Physical Therapy Treatment Patient Details Name: Walter Patrick MRN: [id number] DOB: [birth year] Today's Date: 11/03/2013 Time: 4098-1191 PT Time Calculation (min): 23 min  PT Assessment / Plan / Recommendation  History of Present Illness LTKA   PT Comments   Pt progressing with mobility.  Plans are for pt to d/c home later today per pt.  Needs to practice steps before d/cing.     Follow Up Recommendations  Home health PT;Supervision/Assistance - 24 hour     Does the patient have the potential to tolerate intense rehabilitation     Barriers to Discharge        Equipment Recommendations  Rolling walker with 5" wheels;3in1 (PT)    Recommendations for Other Services OT consult  Frequency 7X/week   Progress towards PT Goals Progress towards PT goals: Progressing toward goals  Plan Current plan remains appropriate    Precautions / Restrictions Precautions Precautions: Knee Restrictions Weight Bearing Restrictions: Yes LLE Weight Bearing: Weight bearing as tolerated   Pertinent Vitals/Pain Reports pain as 3-4/10 but states he has a high tolerance to pain & that it's hurting "bad"    Mobility  Bed Mobility Bed Mobility: Supine to Sit;Sitting - Scoot to Edge of Bed Supine to Sit: 5: Supervision;HOB flat Sitting - Scoot to Edge of Bed: 5: Supervision Transfers Transfers: Sit to Stand;Stand to Sit Sit to Stand: 4: Min guard;With upper extremity assist;From bed Stand to Sit: 4: Min guard;With upper extremity assist;With armrests;To chair/3-in-1 Details for Transfer Assistance: cues to reinforce hand placement.  Guarding for safety.   Ambulation/Gait Ambulation/Gait Assistance: 4: Min guard Ambulation Distance (Feet): 60 Feet Assistive device: Rolling walker Ambulation/Gait Assistance Details: cues for sequencing, tall posture, quad activation LLE, & terminal knee extension LLE.   Gait Pattern: Step-to pattern;Decreased step length - right;Decreased weight shift to left;Left  flexed knee in stance Gait velocity: decreased Stairs: No Wheelchair Mobility Wheelchair Mobility: No    Exercises Total Joint Exercises Ankle Circles/Pumps: AROM;Both;10 reps Quad Sets: AROM;Strengthening;Both;10 reps Heel Slides: AROM;Strengthening;Left;10 reps Hip ABduction/ADduction: Strengthening;Left;10 reps;AAROM Straight Leg Raises: AAROM;Strengthening;Left;10 reps Long Arc Quad: AAROM;Strengthening;Left;10 reps    PT Goals (current goals can now be found in the care plan section) Acute Rehab PT Goals Patient Stated Goal: I am going home at 6 O'Clock tonight" PT Goal Formulation: With patient Time For Goal Achievement: 11/09/13 Potential to Achieve Goals: Good  Visit Information  Last PT Received On: 11/03/13 Assistance Needed: +1 History of Present Illness: LTKA    Subjective Data  Patient Stated Goal: I am going home at 6 O'Clock tonight"   Cognition  Cognition Arousal/Alertness: Awake/alert Behavior During Therapy: WFL for tasks assessed/performed Overall Cognitive Status: Within Functional Limits for tasks assessed    Balance  Balance Balance Assessed: Yes Static Sitting Balance Static Sitting - Balance Support: No upper extremity supported;Feet supported Static Standing Balance Static Standing - Balance Support: Bilateral upper extremity supported;No upper extremity supported;During functional activity  End of Session PT - End of Session Equipment Utilized During Treatment: Gait belt Activity Tolerance: Patient tolerated treatment well Patient left: in chair;with call bell/phone within reach Nurse Communication: Mobility status   GP     Lara Mulch 11/03/2013, 1:09 PM   Verdell Face, PTA [phone] 11/03/2013

## 2013-11-03 NOTE — Care Management Note (Signed)
CARE MANAGEMENT NOTE 11/03/2013  Patient:  ERROL, ALA   Account Number:  [id number]  Date Initiated:  11/02/2013  Documentation initiated by:  Vance Peper  Subjective/Objective Assessment:   79 yr old male s/p left total knee arthroplasty.     Action/Plan:   PT/OT eval.  Patient preoperatively setup with [facility].No changes. [employer] to deliver equipment.   Anticipated DC Date:  11/03/2013   Anticipated DC Plan:  HOME W HOME HEALTH SERVICES      DC Planning Services  CM consult      [facility] Choice  HOME HEALTH   Choice offered to / List presented to:  C-1 Patient   DME arranged  CPM      DME agency  [employer]     HH arranged  HH-2 PT      [facility] agency  [facility]   Status of service:  Completed, signed off Medicare Important Message given?   (If response is "NO", the following Medicare IM given date fields will be blank) Date Medicare IM given:   Date Additional Medicare IM given:    Discharge Disposition:  HOME W HOME HEALTH SERVICES  Per UR Regulation:    If discussed at Long Length of Stay Meetings, dates discussed:    Comments:

## 2013-11-03 NOTE — Progress Notes (Signed)
Pt had episode of ST starting at 2345 with HR of 140-151, lasting approx 1 minute, asymptomatic (pt was not even aware of elevated HR), had pt bear down x 3 until HR quickly returned to 60s, pt was awake when episode started.

## 2013-11-04 ENCOUNTER — Ambulatory Visit (INDEPENDENT_AMBULATORY_CARE_PROVIDER_SITE_OTHER): Payer: Medicare Other | Admitting: *Deleted

## 2013-11-04 DIAGNOSIS — R55 Syncope and collapse: Secondary | ICD-10-CM

## 2013-11-04 LAB — MDC_IDC_ENUM_SESS_TYPE_REMOTE

## 2013-11-06 ENCOUNTER — Telehealth: Payer: Self-pay | Admitting: Family Medicine

## 2013-11-06 ENCOUNTER — Encounter: Payer: Self-pay | Admitting: *Deleted

## 2013-11-06 MED ORDER — CIPROFLOXACIN HCL 500 MG PO TABS: 500.0000 mg | ORAL_TABLET | Freq: Two times a day (BID) | ORAL | Status: DC

## 2013-11-06 NOTE — Telephone Encounter (Signed)
Discussed pt with Dr. Caryl Never verbal order given Cipro 500 mg BID x 7 days.

## 2013-11-06 NOTE — Telephone Encounter (Signed)
Left message at home and on daughter's number to call office. Discussed with Dr. Caryl Never need more details, pt is always going to have bacteria due to Urostomy, unless running fever no need for antibiotics.

## 2013-11-06 NOTE — Telephone Encounter (Signed)
Called Walter Patrick back told her will send Rx for Cipro 500 mg BID x 7 days, if symptoms persist need to make an appt to have urine culture done. Walter Patrick verbalized understanding. Rx sent to pharmacy.

## 2013-11-06 NOTE — Telephone Encounter (Signed)
Patient Information:  Caller Name: Jola Babinski  Phone: [phone]  Patient: Walter Patrick, Walter Patrick  Gender: Male  DOB: [birth year]  Age: 79 Years  PCP: Evelena Peat [facility])  Office Follow Up:  Does the office need to follow up with this patient?: Yes  Instructions For The Office: Patient with Post operative knee replacement. UTI prior to surgery . On cipro from 12/4-12/11.  Symptoms of UTI again.  Please contact daughter.  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  Daughter is concerned about Urine. Father is postoperative Knee replacement. Requesting antibiotics.  PLEASE CONTACT DAUGHTER FOR ASSISTANCE.  Symptoms  Reason For Call & Symptoms: Daughter is calling about her father.  He had knee replacement surgery on Monday 11/02/13.  He has urostomy. He was on cipro from 12/4-12/11 for a UTI and his urine is cloudy.  No blood,  With surgery he is taking Celebrex bid, Oxycodone prn.  Unsure of fever, she is not with her father this morning. Requesting assistance.  Reviewed Health History In EMR: Yes  Reviewed Medications In EMR: Yes  Reviewed Allergies In EMR: N/A  Reviewed Surgeries / Procedures: Yes  Date of Onset of Symptoms: 11/05/2013  Guideline(s) Used:  Urination Pain - Male  Disposition Per Guideline:   Go to Office Now  Reason For Disposition Reached:   Side (flank) or lower back pain present  Advice Given:  Fluids  : Drink extra fluids (Reason: to produce a dilute, nonirritating urine).  Call Back If:   You become worse.  RN Overrode Recommendation:  Patient Requests Prescription  Patient with Post operative knee replacement. UTI prior to surgery . On cipro from 12/4-12/11.  Symptoms of UTI again.  Please contact daughter.

## 2013-11-06 NOTE — Telephone Encounter (Signed)
Pt's daughter called back, asked her if pt is having a fever?  Jola Babinski said no, but the urine is very cloudy with globs in it and her father says it is just not right.? Told her Dr.Burchette said he is always going to have bacteria due to urostomy but will discuss with him again and get back to her. Jola Babinski verbalized understanding.

## 2013-11-16 ENCOUNTER — Encounter: Payer: Self-pay | Admitting: Internal Medicine

## 2013-11-17 ENCOUNTER — Encounter: Payer: Self-pay | Admitting: Internal Medicine

## 2013-11-17 NOTE — Discharge Summary (Signed)
SPORTS MEDICINE & JOINT REPLACEMENT   Georgena Spurling, MD   Altamese Cabal, PA-C [address] [place], [place], [place]  [address]                             [phone]  PATIENT ID: Walter Patrick        MRN:  [id number]          DOB/AGE: 1932/03/14 / 79 y.o.    DISCHARGE SUMMARY  ADMISSION DATE:    11/02/2013 DISCHARGE DATE:   11/03/2013  ADMISSION DIAGNOSIS: osteoarthritis left knee    DISCHARGE DIAGNOSIS:  osteoarthritis left knee    ADDITIONAL DIAGNOSIS: Active Problems:   S/P total knee arthroplasty   Protein-calorie malnutrition, severe  Past Medical History  Diagnosis Date  . SKIN CANCER, HX OF 11/30/2010  . Arthritis - severe knees   . SOB (shortness of breath) 07/18/2007    Echo showed Normal LV size. Mild DUST-No LV outfolw obstruction. Systolic function normal. EF>55%. No regional wall abn. Mild regurg in mitral and pulmonic valves, but no significant valvular disease noted.  . Palpitations 07/18/07  . Pre-syncope 07/18/2007    Normal NUC and ECHO.  . ESSENTIAL HYPERTENSION 11/30/2010  . Cancer bladder s/p cystectomy     cystostomy has cysto bag    PROCEDURE: Procedure(s): TOTAL KNEE ARTHROPLASTY on 11/02/2013  CONSULTS:     HISTORY:  See H&P in chart  HOSPITAL COURSE:  Walter Patrick is a 79 y.o. admitted on 11/02/2013 and found to have a diagnosis of osteoarthritis left knee.  After appropriate laboratory studies were obtained  they were taken to the operating room on 11/02/2013 and underwent Procedure(s): TOTAL KNEE ARTHROPLASTY.   They were given perioperative antibiotics:  Anti-infectives   Start     Dose/Rate Route Frequency Ordered Stop   11/02/13 1400  ceFAZolin (ANCEF) IVPB 1 g/50 mL premix     1 g 100 mL/hr over 30 Minutes Intravenous Every 6 hours 11/02/13 1048 11/02/13 2110   11/02/13 0600  ceFAZolin (ANCEF) 3 g in dextrose 5 % 50 mL IVPB     3 g 160 mL/hr over 30 Minutes Intravenous On call to O.R. 11/01/13 1436 11/02/13 0739     .  Tolerated the procedure well.  Placed with a foley intraoperatively.  Given Ofirmev at induction and for 48 hours.    POD# 1: Vital signs were stable.  Patient denied Chest pain, shortness of breath, or calf pain.  Patient was started on Lovenox 30 mg subcutaneously twice daily at 8am.  Consults to PT, OT, and care management were made.  The patient was weight bearing as tolerated.  CPM was placed on the operative leg 0-90 degrees for 6-8 hours a day.  Incentive spirometry was taught.  Dressing was changed.  Marcaine pump and hemovac were discontinued.      POD #2, Continued  PT for ambulation and exercise program.  IV saline locked.  O2 discontinued.    The remainder of the hospital course was dedicated to ambulation and strengthening.   The patient was discharged on 1 day post op in  Good condition.  Blood products given:none  DIAGNOSTIC STUDIES: Recent vital signs: No data found.      Recent laboratory studies: No results found for this basename: WBC, HGB, HCT, PLT,  in the last 168 hours No results found for this basename: NA, K, CL, CO2, BUN, CREATININE, GLUCOSE, CALCIUM,  in the last  168 hours Lab Results  Component Value Date   INR 1.00 10/20/2013   INR 1.03 08/31/2013     Recent Radiographic Studies :  Dg Chest 2 View  10/20/2013   CLINICAL DATA:  Shortness of breath.  EXAM: CHEST  2 VIEW  COMPARISON:  03/27/2013 and 03/05/2011  FINDINGS: Two views of the chest were obtained. There is evidence for bilateral nipple shadows. There is a stable 6 mm nodular density in the right lower chest which probably represents a calcified granuloma. Small electronic device overlying the left side of the chest. Heart and mediastinum are stable and within normal limits. Stable metallic fragments overlying the right scapula. No focal airspace disease or edema. Stable osteophytes in the thoracic spine. Again noted is a compression deformity and kyphosis in lower thoracic spine. Stable linear  density at the right lung base may represent some scarring.  IMPRESSION: No acute chest abnormality.  Stable 6 mm nodule in the right lung probably represents a granuloma.  Probable bilateral nipple shadows as described.  Chronic bone changes.   Electronically Signed   By: Walter Patrick M.D.   On: 10/20/2013 17:55    DISCHARGE INSTRUCTIONS: Discharge Orders   Future Appointments Provider Department Dept Phone   12/18/2013 9:30 AM Duke Salvia, MD [facility] Office [phone]   Future Orders Complete By Expires   Call MD / Call 911  As directed    Comments:     If you experience chest pain or shortness of breath, CALL 911 and be transported to the hospital emergency room.  If you develope a fever above 101 F, pus (white drainage) or increased drainage or redness at the wound, or calf pain, call your surgeon's office.   Change dressing  As directed    Comments:     Change dressing on wednesday, then change the dressing daily with sterile 4 x 4 inch gauze dressing and apply TED hose.   Constipation Prevention  As directed    Comments:     Drink plenty of fluids.  Prune juice may be helpful.  You may use a stool softener, such as Colace (over the counter) 100 mg twice a day.  Use MiraLax (over the counter) for constipation as needed.   CPM  As directed    Comments:     Continuous passive motion machine (CPM):      Use the CPM from 0 to 90 for 6-8 hours per day.      You may increase by 10 per day.  You may break it up into 2 or 3 sessions per day.      Use CPM for 2 weeks or until you are told to stop.   Diet - low sodium heart healthy  As directed    Do not put a pillow under the knee. Place it under the heel.  As directed    Driving restrictions  As directed    Comments:     No driving for 6 weeks   Increase activity slowly as tolerated  As directed    Lifting restrictions  As directed    Comments:     No lifting for 6 weeks   TED hose  As directed    Comments:     Use  stockings (TED hose) for 3 weeks on both leg(s).  You may remove them at night for sleeping.      DISCHARGE MEDICATIONS:     Medication List    STOP taking  these medications       aspirin 325 MG EC tablet     ibuprofen 200 MG tablet  Commonly known as:  ADVIL,MOTRIN      TAKE these medications       amLODipine 5 MG tablet  Commonly known as:  NORVASC  Take 1 tablet (5 mg total) by mouth daily.     celecoxib 200 MG capsule  Commonly known as:  CELEBREX  Take 1 capsule (200 mg total) by mouth 2 (two) times daily.     enoxaparin 40 MG/0.4ML injection  Commonly known as:  LOVENOX  Inject 0.4 mLs (40 mg total) into the skin daily.     metoprolol tartrate 25 MG tablet  Commonly known as:  LOPRESSOR  Take 1 tablet (25 mg total) by mouth 2 (two) times daily.     oxyCODONE 5 MG immediate release tablet  Commonly known as:  Oxy IR/ROXICODONE  Take 1-2 tablets (5-10 mg total) by mouth every 3 (three) hours as needed for breakthrough pain.        FOLLOW UP VISIT:       Follow-up Information   Follow up with Raymon Mutton, MD. Call on 11/17/2013.   Specialty:  Orthopedic Surgery   Contact information:   [address] [place] [address] [phone]       DISPOSITION: HOME  CONDITION:  Good   Gorden Stthomas 11/17/2013, 11:47 AM

## 2013-11-20 ENCOUNTER — Ambulatory Visit (INDEPENDENT_AMBULATORY_CARE_PROVIDER_SITE_OTHER): Payer: Medicare Other | Admitting: Family Medicine

## 2013-11-20 ENCOUNTER — Encounter: Payer: Self-pay | Admitting: Family Medicine

## 2013-11-20 VITALS — BP 92/60 | Temp 97.8°F | Wt 126.0 lb

## 2013-11-20 DIAGNOSIS — R300 Dysuria: Secondary | ICD-10-CM

## 2013-11-20 LAB — POCT URINALYSIS DIPSTICK
Glucose, UA: NEGATIVE
LEUKOCYTES UA: NEGATIVE
Nitrite, UA: POSITIVE
Spec Grav, UA: 1.02
Urobilinogen, UA: 1
pH, UA: 6.5

## 2013-11-20 NOTE — Progress Notes (Signed)
Pre visit review using our clinic review tool, if applicable. No additional management support is needed unless otherwise documented below in the visit note. 

## 2013-11-20 NOTE — Progress Notes (Signed)
Chief Complaint  Patient presents with  . Cystitis    started Dec 2014    HPI:  Acute visit for follow up on urine: -per review of chart bacteruria found prior to recent knee surgery -per PCP notes"he has history of bladder cancer and has an external conduit with bag so he is very likely colonized. He was not describing any symptoms such as fever or chills. His culture did grow Escherichia coli and he was treated with cipro." -he had another course of cipro rxd by PCP about 1-2 weeks ago -he is not having any symtoms and feels well and urine actually looks clearer today but he thought he was supposed to follow up for repeat urine culture -he did not have anything to drink all day today -he has had some upset stomach on the antibiotics and the pain medications from the surgery with some constipation and loss of appetite and acute loss of weight since surgery - this is improving now that he finished abx and pain medications -denies: fevers, chills, blood in urine, abd pain, change in appearance of urine  ROS: See pertinent positives and negatives per HPI.  Past Medical History  Diagnosis Date  . SKIN CANCER, HX OF 11/30/2010  . Arthritis - severe knees   . SOB (shortness of breath) 07/18/2007    Echo showed Normal LV size. Mild DUST-No LV outfolw obstruction. Systolic function normal. EF>55%. No regional wall abn. Mild regurg in mitral and pulmonic valves, but no significant valvular disease noted.  . Palpitations 07/18/07  . Pre-syncope 07/18/2007    Normal NUC and ECHO.  . ESSENTIAL HYPERTENSION 11/30/2010  . Cancer bladder s/p cystectomy     cystostomy has cysto bag    Past Surgical History  Procedure Laterality Date  . Cystectomy  06/27/11  . US echocardiography  07/18/2007    showed Normal LV size. Mild DUST-No LV outfolw obstruction. Systolic function normal. EF>55%. No regional wall abn. Mild regurg in mitral and pulmonic valves, but no significant valvular disease noted.  .  Cardiovascular stress test  07/18/2007    NUC indicated a normal pattern of perfusion in all regions post stress.Post stress LV is normal size. EF of 54%.  No evidence of inducible ischemia per EKG. Normal, low risk scan.  . Eye surgery Bilateral     cataracts  . Fracture surgery Left     x2 basket ball and football in high school  . Joint replacement Left 11/02/13    knee replacement  . Total knee arthroplasty Left 11/02/2013    Procedure: TOTAL KNEE ARTHROPLASTY;  Surgeon: Vickey Huger, MD;  Location: [facility];  Service: Orthopedics;  Laterality: Left;    Family History  Problem Relation Age of Onset  . Heart disease Father     History   Social History  . Marital Status: Single    Spouse Name: N/A    Number of Children: N/A  . Years of Education: N/A   Social History Main Topics  . Smoking status: Never Smoker   . Smokeless tobacco: Never Used  . Alcohol Use: 8.4 oz/week    14 Cans of beer per week     Comment: beers  . Drug Use: No  . Sexual Activity: None   Other Topics Concern  . None   Social History Narrative  . None    Current outpatient prescriptions:amLODipine (NORVASC) 5 MG tablet, Take 1 tablet (5 mg total) by mouth daily., Disp: 30 tablet, Rfl: 11;  celecoxib (CELEBREX) 200  MG capsule, Take 1 capsule (200 mg total) by mouth 2 (two) times daily., Disp: 60 capsule, Rfl: 2;  enoxaparin (LOVENOX) 40 MG/0.4ML injection, Inject 0.4 mLs (40 mg total) into the skin daily., Disp: 13 Syringe, Rfl: 0 metoprolol tartrate (LOPRESSOR) 25 MG tablet, Take 1 tablet (25 mg total) by mouth 2 (two) times daily., Disp: 60 tablet, Rfl: 5;  HYDROcodone-acetaminophen (NORCO/VICODIN) 5-325 MG per tablet, Take 1 tablet by mouth every 6 (six) hours as needed. , Disp: , Rfl:   EXAM:  Filed Vitals:   11/20/13 1427  BP: 92/60  Temp: 97.8 F (36.6 C)    Body mass index is 18.6 kg/(m^2).  GENERAL: vitals reviewed and listed above, alert, oriented, appears well hydrated and in no acute  distress  HEENT: atraumatic, conjunttiva clear, no obvious abnormalities on inspection of external nose and ears  NECK: no obvious masses on inspection  LUNGS: clear to auscultation bilaterally, no wheezes, rales or rhonchi, good air movement  CV: HRRR, no peripheral edema  MS: moves all extremities without noticeable abnormality  PSYCH: pleasant and cooperative, no obvious depression or anxiety  ASSESSMENT AND PLAN:  Discussed the following assessment and plan:  Dysuria - Plan: POCT urinalysis dipstick  -udip abnormal with no symptoms and suspect chronic colonization and offered tx versus observation and tx if symptoms vs referral to urologist -will forward these results to pcp as they report he told them to come repeat urine -he had ecoli on recent culture and suseptable to cipro and tx twice with this -he prefers to hold off on tx given upsets stomach -advised he add ensure and metameucil for GI complaints and to help build weight back up that he has lost and close follow up with PCP -he and daughter agree to this plan -of course, Patient advised to return or notify a doctor immediately if symptoms worsen or new concerns arise.  Patient Instructions  -ensure clear twice daily  -healthy diet and plenty of fluids  -I will notify of your urine results  -follow up with your doctor in 2-3 weeks or sooner if any concerns     KIM, HANNAH R.

## 2013-11-20 NOTE — Patient Instructions (Signed)
-  ensure clear twice daily  -healthy diet and plenty of fluids  -I will notify of your urine results  -follow up with your doctor in 2-3 weeks or sooner if any concerns

## 2013-11-23 ENCOUNTER — Ambulatory Visit (INDEPENDENT_AMBULATORY_CARE_PROVIDER_SITE_OTHER): Payer: Medicare Other | Admitting: *Deleted

## 2013-11-23 ENCOUNTER — Encounter: Payer: Self-pay | Admitting: Internal Medicine

## 2013-11-23 DIAGNOSIS — R55 Syncope and collapse: Secondary | ICD-10-CM

## 2013-11-23 LAB — MDC_IDC_ENUM_SESS_TYPE_REMOTE

## 2013-12-07 ENCOUNTER — Encounter: Payer: Self-pay | Admitting: Internal Medicine

## 2013-12-15 ENCOUNTER — Encounter: Payer: Self-pay | Admitting: Internal Medicine

## 2013-12-18 ENCOUNTER — Encounter: Payer: Medicare Other | Admitting: Internal Medicine

## 2013-12-28 ENCOUNTER — Telehealth: Payer: Self-pay | Admitting: Oncology

## 2013-12-28 ENCOUNTER — Other Ambulatory Visit: Payer: Self-pay | Admitting: Urology

## 2013-12-28 DIAGNOSIS — N1330 Unspecified hydronephrosis: Secondary | ICD-10-CM

## 2013-12-28 DIAGNOSIS — R1900 Intra-abdominal and pelvic swelling, mass and lump, unspecified site: Secondary | ICD-10-CM

## 2013-12-28 NOTE — Telephone Encounter (Signed)
Left pt vm in ref to np appt. °

## 2013-12-29 ENCOUNTER — Telehealth: Payer: Self-pay | Admitting: Oncology

## 2013-12-29 NOTE — Telephone Encounter (Signed)
CALLED PATIENT TO SCHEDULE PER PATIENT HE WILL HAVE DTR CALL WHEN SHE RETURN HOME.

## 2013-12-29 NOTE — Telephone Encounter (Signed)
2ND. LEFT MESSAGE FOR PATIENT TO RETURN CALL TO SCHEDULE NEW PATIENT APPT.

## 2014-01-04 ENCOUNTER — Other Ambulatory Visit: Payer: Self-pay | Admitting: Oncology

## 2014-01-04 ENCOUNTER — Telehealth: Payer: Self-pay | Admitting: Oncology

## 2014-01-04 DIAGNOSIS — C679 Malignant neoplasm of bladder, unspecified: Secondary | ICD-10-CM

## 2014-01-04 NOTE — Telephone Encounter (Signed)
C/D 01/04/14 for appt. 01/07/14

## 2014-01-05 ENCOUNTER — Ambulatory Visit: Payer: Medicare Other | Admitting: *Deleted

## 2014-01-06 ENCOUNTER — Telehealth: Payer: Self-pay | Admitting: Medical Oncology

## 2014-01-06 NOTE — Telephone Encounter (Signed)
LVMOM with patient confirming tomorrows appt's and left reminder of times. Also asked patient to bring a current list of medications and to inform patient of free [place] parking. Asked patient to return call to clinic should he have any questions and to confirm receipt of message.

## 2014-01-07 ENCOUNTER — Ambulatory Visit (HOSPITAL_BASED_OUTPATIENT_CLINIC_OR_DEPARTMENT_OTHER): Payer: Medicare Other | Admitting: Oncology

## 2014-01-07 ENCOUNTER — Encounter (INDEPENDENT_AMBULATORY_CARE_PROVIDER_SITE_OTHER): Payer: Self-pay

## 2014-01-07 ENCOUNTER — Other Ambulatory Visit (HOSPITAL_BASED_OUTPATIENT_CLINIC_OR_DEPARTMENT_OTHER): Payer: Medicare Other

## 2014-01-07 ENCOUNTER — Encounter: Payer: Self-pay | Admitting: Oncology

## 2014-01-07 ENCOUNTER — Ambulatory Visit (HOSPITAL_BASED_OUTPATIENT_CLINIC_OR_DEPARTMENT_OTHER): Payer: Medicare Other

## 2014-01-07 VITALS — BP 117/49 | HR 82 | Temp 96.8°F | Resp 18 | Ht 69.0 in | Wt 126.6 lb

## 2014-01-07 DIAGNOSIS — R630 Anorexia: Secondary | ICD-10-CM

## 2014-01-07 DIAGNOSIS — C679 Malignant neoplasm of bladder, unspecified: Secondary | ICD-10-CM

## 2014-01-07 DIAGNOSIS — R634 Abnormal weight loss: Secondary | ICD-10-CM

## 2014-01-07 DIAGNOSIS — N1330 Unspecified hydronephrosis: Secondary | ICD-10-CM

## 2014-01-07 DIAGNOSIS — K5900 Constipation, unspecified: Secondary | ICD-10-CM

## 2014-01-07 DIAGNOSIS — C786 Secondary malignant neoplasm of retroperitoneum and peritoneum: Secondary | ICD-10-CM

## 2014-01-07 LAB — CBC WITH DIFFERENTIAL/PLATELET
BASO%: 0.1 % (ref 0.0–2.0)
Basophils Absolute: 0 10*3/uL (ref 0.0–0.1)
EOS ABS: 0 10*3/uL (ref 0.0–0.5)
EOS%: 0.2 % (ref 0.0–7.0)
HCT: 35.1 % — ABNORMAL LOW (ref 38.4–49.9)
HGB: 11.6 g/dL — ABNORMAL LOW (ref 13.0–17.1)
LYMPH%: 8.3 % — ABNORMAL LOW (ref 14.0–49.0)
MCH: 30.8 pg (ref 27.2–33.4)
MCHC: 32.9 g/dL (ref 32.0–36.0)
MCV: 93.7 fL (ref 79.3–98.0)
MONO#: 0.7 10*3/uL (ref 0.1–0.9)
MONO%: 6.3 % (ref 0.0–14.0)
NEUT%: 85.1 % — ABNORMAL HIGH (ref 39.0–75.0)
NEUTROS ABS: 9.8 10*3/uL — AB (ref 1.5–6.5)
Platelets: 276 10*3/uL (ref 140–400)
RBC: 3.75 10*6/uL — AB (ref 4.20–5.82)
RDW: 13.9 % (ref 11.0–14.6)
WBC: 11.5 10*3/uL — AB (ref 4.0–10.3)
lymph#: 1 10*3/uL (ref 0.9–3.3)

## 2014-01-07 LAB — COMPREHENSIVE METABOLIC PANEL (CC13)
ALBUMIN: 2.9 g/dL — AB (ref 3.5–5.0)
ALT: 13 U/L (ref 0–55)
AST: 32 U/L (ref 5–34)
Alkaline Phosphatase: 249 U/L — ABNORMAL HIGH (ref 40–150)
Anion Gap: 10 mEq/L (ref 3–11)
BUN: 23.3 mg/dL (ref 7.0–26.0)
CO2: 29 mEq/L (ref 22–29)
Calcium: 10.4 mg/dL (ref 8.4–10.4)
Chloride: 98 mEq/L (ref 98–109)
Creatinine: 0.8 mg/dL (ref 0.7–1.3)
GLUCOSE: 104 mg/dL (ref 70–140)
POTASSIUM: 4 meq/L (ref 3.5–5.1)
SODIUM: 136 meq/L (ref 136–145)
Total Bilirubin: 0.55 mg/dL (ref 0.20–1.20)
Total Protein: 6.7 g/dL (ref 6.4–8.3)

## 2014-01-07 NOTE — Progress Notes (Signed)
Please see consult note.  

## 2014-01-07 NOTE — Consult Note (Signed)
Reason for Referral: Bladder cancer.   HPI: 80 year old gentleman native of [place] but have been living in this area for the last 40 years. He is a gentleman with past medical history significant for cardiac arrhythmia as well as bladder cancer. His history of cancer dates back to at least 2012 where he presented with gross hematuria and found to have a high-grade invasive urothelial carcinoma asked that a TURBT. He underwent radical cystoprostatectomy on June 27 2011 and the pathology revealed high-grade invasive urothelial carcinoma with the a T2 B. he also had 17 lymph nodes sampled none of them involved with the cancer. This was performed by Dr. Luberta Robertson and the patient failed to show up to multiple follow ups Recently hepresented with symptoms of weight loss and overall failure to thrive. He was evaluated by Dr. Luberta Robertson with a CT scan without contrast and the hematuria protocol and found to have a large tumor measuring 10.5 cm in the left pelvic wall area. He also was found to have retroperitoneal nodal metastasis as well as a left hydronephrosis. Patient referred to me for an evaluation regarding these new findings. Clinically, he's been doing very poorly. He is reporting weight loss as well as poor appetite. He has noticed decline in his performance status and activity level and he is predominantly sitting in a chair or in bed. He is reporting constipation but no diarrhea. Is not reporting any abdominal pain. He is not reporting any hematuria or dysuria. He is now able to drive and he has not driven for quite a long period his daughter accompanied him today and lives close by and checks on him periodically.   Past Medical History  Diagnosis Date  . SKIN CANCER, HX OF 11/30/2010  . Arthritis - severe knees   . SOB (shortness of breath) 07/18/2007    Echo showed Normal LV size. Mild DUST-No LV outfolw obstruction. Systolic function normal. EF>55%. No regional wall abn. Mild regurg in mitral  and pulmonic valves, but no significant valvular disease noted.  . Palpitations 07/18/07  . Pre-syncope 07/18/2007    Normal NUC and ECHO.  . ESSENTIAL HYPERTENSION 11/30/2010  . Cancer bladder s/p cystectomy     cystostomy has cysto bag  :  Past Surgical History  Procedure Laterality Date  . Cystectomy  06/27/11  . US echocardiography  07/18/2007    showed Normal LV size. Mild DUST-No LV outfolw obstruction. Systolic function normal. EF>55%. No regional wall abn. Mild regurg in mitral and pulmonic valves, but no significant valvular disease noted.  . Cardiovascular stress test  07/18/2007    NUC indicated a normal pattern of perfusion in all regions post stress.Post stress LV is normal size. EF of 54%.  No evidence of inducible ischemia per EKG. Normal, low risk scan.  . Eye surgery Bilateral     cataracts  . Fracture surgery Left     x2 basket ball and football in high school  . Joint replacement Left 11/02/13    knee replacement  . Total knee arthroplasty Left 11/02/2013    Procedure: TOTAL KNEE ARTHROPLASTY;  Surgeon: Vickey Huger, MD;  Location: [facility];  Service: Orthopedics;  Laterality: Left;  : Current Outpatient Prescriptions  Medication Sig Dispense Refill  . amLODipine (NORVASC) 5 MG tablet Take 1 tablet (5 mg total) by mouth daily.  30 tablet  11  . aspirin 325 MG tablet Take 325 mg by mouth daily.      . metoprolol tartrate (LOPRESSOR) 25 MG tablet Take  1 tablet (25 mg total) by mouth 2 (two) times daily.  60 tablet  5   No current facility-administered medications for this visit.     Allergies  Allergen Reactions  . Hydrocodone     GI upset, caused him to have no taste in his mouth  :  Family History  Problem Relation Age of Onset  . Heart disease Father   :  History   Social History  . Marital Status: Single    Spouse Name: N/A    Number of Children: N/A  . Years of Education: N/A   Occupational History  . Not on file.   Social History Main Topics  .  Smoking status: Never Smoker   . Smokeless tobacco: Never Used  . Alcohol Use: 8.4 oz/week    14 Cans of beer per week     Comment: beers  . Drug Use: No  . Sexual Activity: Not on file   Other Topics Concern  . Not on file   Social History Narrative  . No narrative on file  :  Constitutional: positive for anorexia, fatigue and weight loss Eyes: negative for icterus, irritation and redness Ears, nose, mouth, throat, and face: negative for hearing loss, hoarseness and nasal congestion Respiratory: positive for dyspnea on exertion Cardiovascular: positive for orthopnea, palpitations and syncope Gastrointestinal: positive for abdominal pain, constipation and dyspepsia Genitourinary:negative for hematuria, hesitancy and nocturia Integument/breast: negative for pruritus and rash Hematologic/lymphatic: negative for bleeding, easy bruising and lymphadenopathy Musculoskeletal:negative for muscle weakness and neck pain Neurological: negative for dizziness and gait problems Behavioral/Psych: negative for anxiety and depression  Exam: ECOG 3 Blood pressure 117/49, pulse 82, temperature 96.8 F (36 C), temperature source Oral, resp. rate 18, height 5\' 9"  (1.753 m), weight 126 lb 9.6 oz (57.425 kg). General appearance: alert, cooperative and appears stated age. Cachectic-appearing appeared in mild distress. Head: Normocephalic, without obvious abnormality, atraumatic Throat: lips, mucosa, and tongue normal; teeth and gums normal Neck: no adenopathy, no carotid bruit, no JVD, supple, symmetrical, trachea midline and thyroid not enlarged, symmetric, no tenderness/mass/nodules Back: symmetric, no curvature. ROM normal. No CVA tenderness. Resp: clear to auscultation bilaterally Chest wall: no tenderness Cardio: regular rate and rhythm, S1, S2 normal, no murmur, click, rub or gallop GI: soft, non-tender; bowel sounds normal; no masses,  no organomegaly. A palpable mass in the left lower  quadrant. Extremities: extremities normal, atraumatic, no cyanosis or edema Pulses: 2+ and symmetric Skin: Skin color, texture, turgor normal. No rashes or lesions Lymph nodes: Cervical, supraclavicular, and axillary nodes normal.   Recent Labs  01/07/14 1350  WBC 11.5*  HGB 11.6*  HCT 35.1*  PLT 276    Recent Labs  01/07/14 1350  NA 136  K 4.0  CO2 29  GLUCOSE 104  BUN 23.3  CREATININE 0.8  CALCIUM 10.4       Assessment and Plan:   80 year old gentleman with the following issues:  1. 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 He was originally diagnosed in 2012 and found to have it T2 N0 disease. Most recently presented with weight loss and failure to thrive and found to have bulky metastatic disease. Now he has about 10.5 cm mass in his pelvic area with lymphadenopathy. Options of treatments were discussed today and clearly no surgery would be indicated. There is no curative options her and the best that we can offer is palliation. The role of systemic chemotherapy as well as the role of radiation therapy were discussed  and I believe they offer very little palliation given the bulk of his disease and the rapid and massive deterioration in his health. I think the risks of chemotherapy outweighs any potential benefit and I have moved that out today. One can consider possible radiation therapy to the pelvic mass if he becomes symptomatic. I discussed it with him at length today his poor prognosis and the fact that he probably needs hospice sooner rather than later. He understands these findings today and will let me know whether he wants to consider hospice at that time.  2. Hydronephrosis: His creatinine is normal today but certainly a preemptive percutaneous tubes can be considered.  3. Followup: I will be happy to see him as needed [place] I. on hospice updates once he is enrolled.

## 2014-01-12 ENCOUNTER — Telehealth: Payer: Self-pay | Admitting: *Deleted

## 2014-01-12 NOTE — Telephone Encounter (Signed)
Spoke with [employer] @ [facility]. Per dr Alen Blew, he will be the attending, ok for hospice physicians to have DNR signed and to do symptom management. They will call patient today. i have called the home and left a message to call me.

## 2014-01-14 ENCOUNTER — Encounter: Payer: Medicare Other | Admitting: Internal Medicine

## 2014-01-18 ENCOUNTER — Telehealth: Payer: Self-pay | Admitting: *Deleted

## 2014-01-18 NOTE — Telephone Encounter (Signed)
Per daughter---pt in [place] at this time/kwm

## 2014-01-25 ENCOUNTER — Other Ambulatory Visit: Payer: Self-pay | Admitting: Cardiology

## 2014-01-25 ENCOUNTER — Telehealth: Payer: Self-pay | Admitting: *Deleted

## 2014-01-25 NOTE — Telephone Encounter (Signed)
Message received from operator that Hospice called reporting this patient passed away at 5:20 pm on 19-Feb-2014.  Will leave this message for provider upon return to office.

## 2014-01-25 NOTE — Telephone Encounter (Signed)
Rx was sent to pharmacy electronically. 

## 2014-02-05 LAB — MDC_IDC_ENUM_SESS_TYPE_REMOTE

## 2014-02-17 DEATH — deceased

## 2014-02-23 ENCOUNTER — Encounter: Payer: Self-pay | Admitting: Internal Medicine

## 2014-03-10 NOTE — Telephone Encounter (Signed)
Encounter closed---TP 03/10/14 

## 2014-04-15 ENCOUNTER — Encounter: Payer: Self-pay | Admitting: Internal Medicine

## 2014-05-08 IMAGING — CR DG CHEST 2V
2 series · 2 of 2 positions shown · non-contrast
Comparison: 03/27/2013 and 03/05/2011

CLINICAL DATA: Shortness of breath.

EXAM:
CHEST  2 VIEW

[w chest pa]
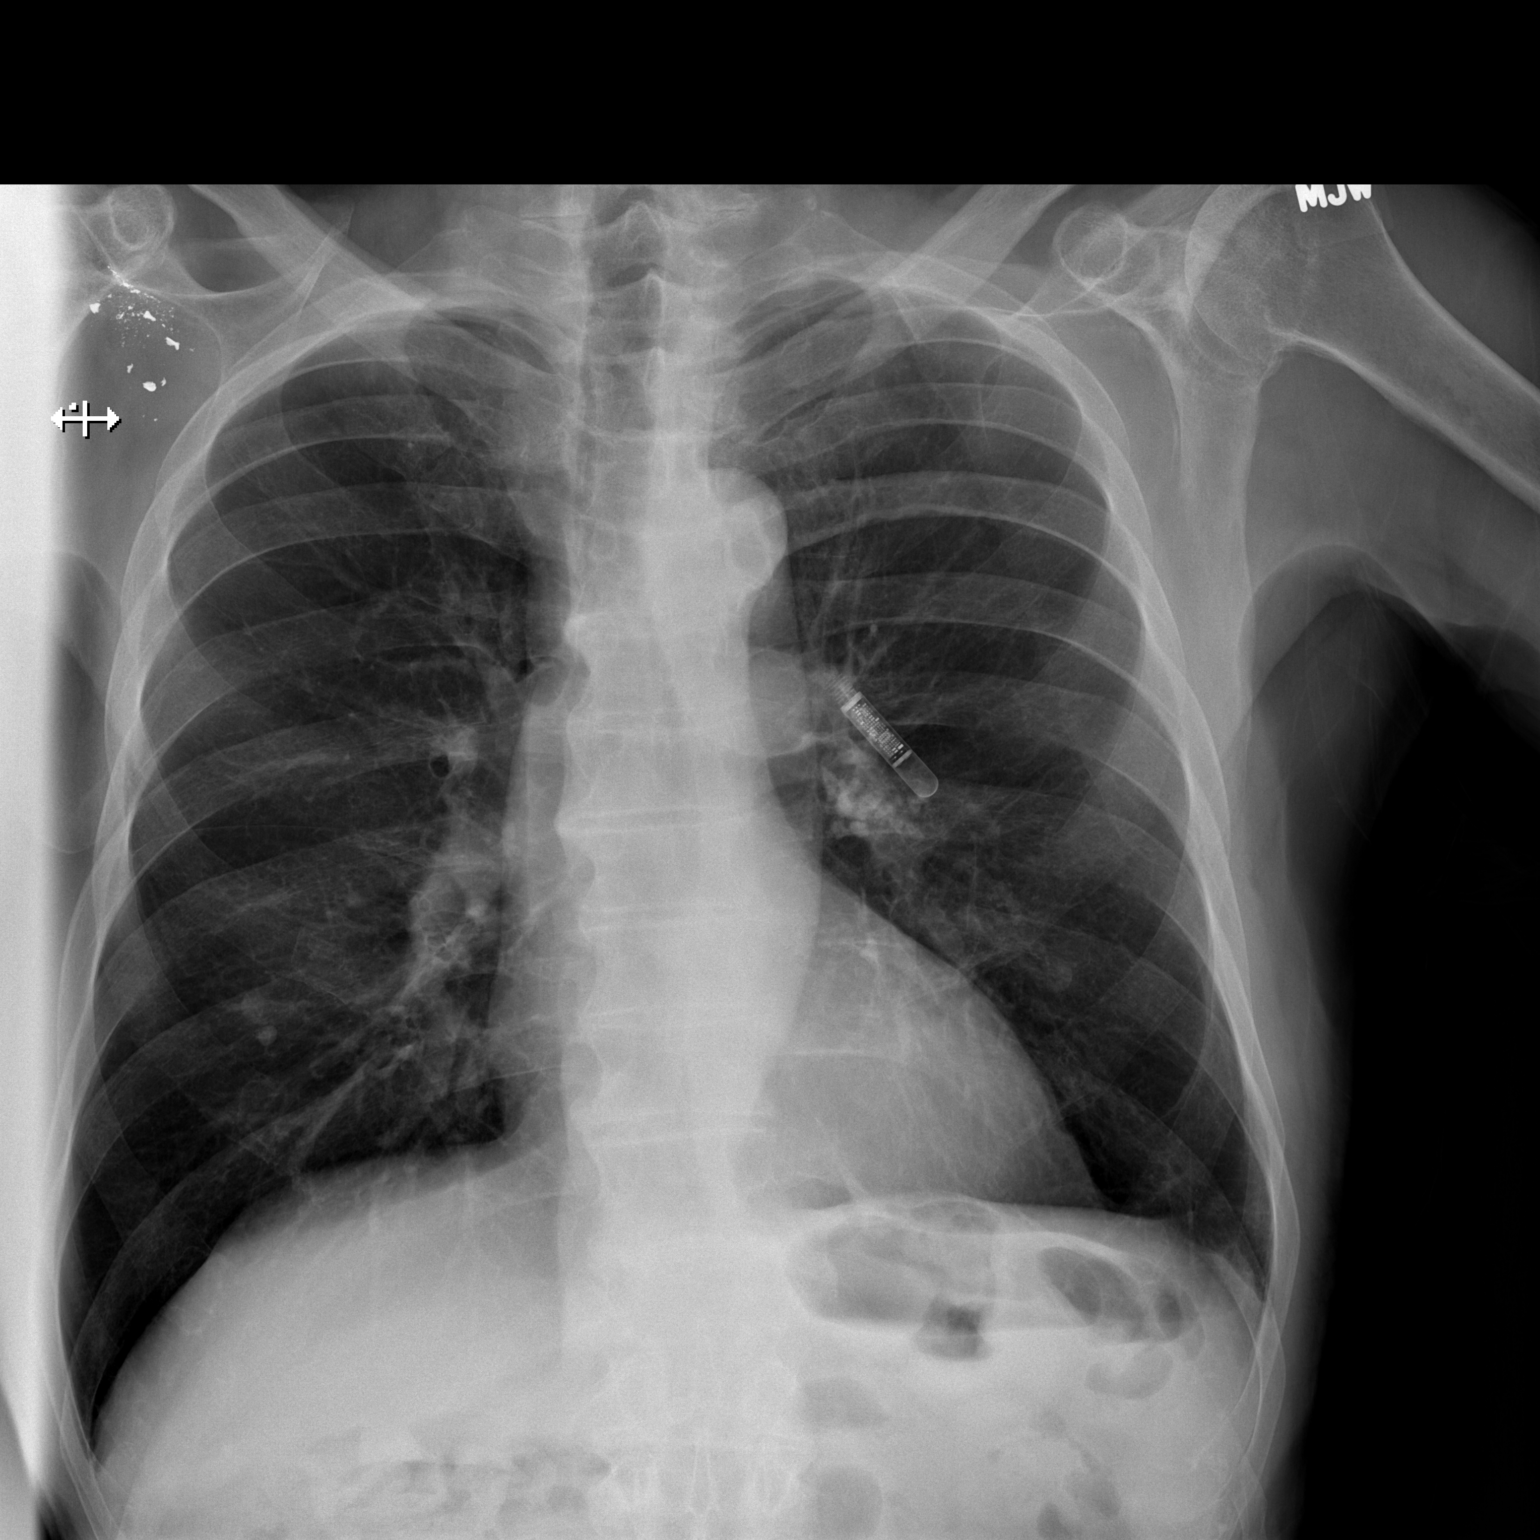

[w chest lat]
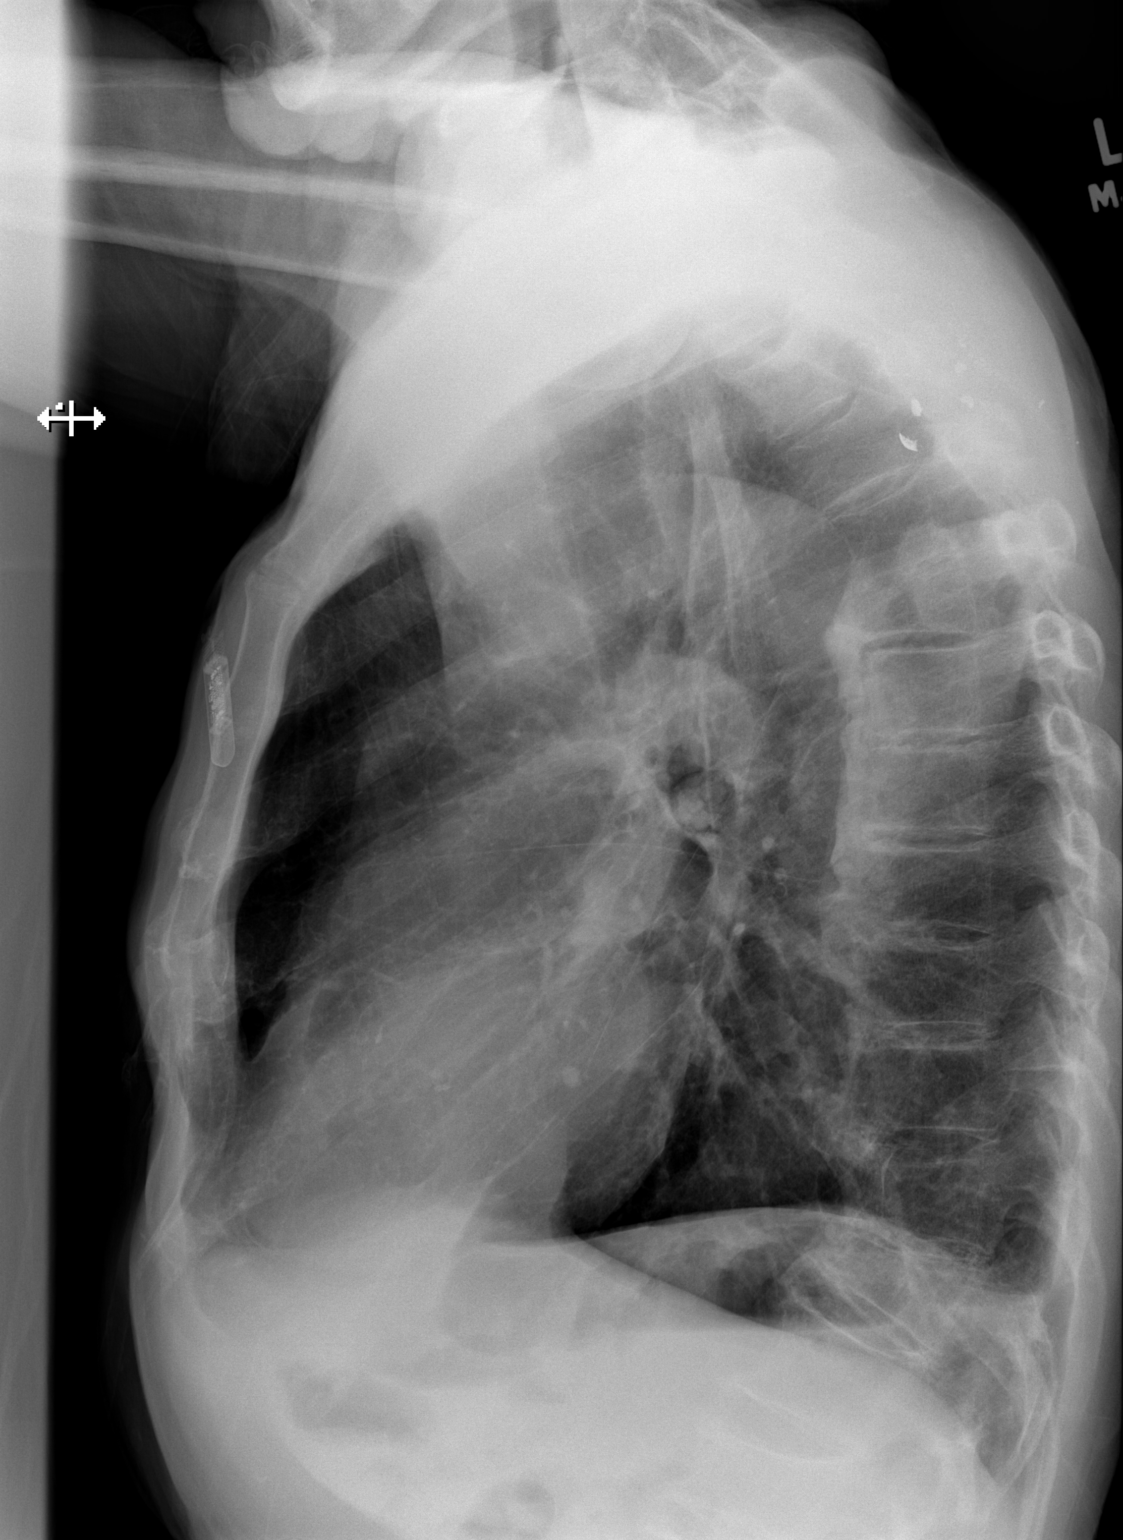

[2 of 2 positions shown; findings below may reference images not displayed]

FINDINGS: Two views of the chest were obtained. There is evidence for
bilateral nipple shadows. There is a stable 6 mm nodular density in
the right lower chest which probably represents a calcified
granuloma. Small electronic device overlying the left side of the
chest. Heart and mediastinum are stable and within normal limits.
Stable metallic fragments overlying the right scapula. No focal
airspace disease or edema. Stable osteophytes in the thoracic spine.
Again noted is a compression deformity and kyphosis in lower
thoracic spine. Stable linear density at the right lung base may
represent some scarring.
IMPRESSION: No acute chest abnormality.

Stable 6 mm nodule in the right lung probably represents a
granuloma.

Probable bilateral nipple shadows as described.

Chronic bone changes.

## 2014-10-28 ENCOUNTER — Encounter (HOSPITAL_COMMUNITY): Payer: Self-pay | Admitting: Internal Medicine
